# Patient Record
Sex: Female | Born: 1985 | Race: White | Hispanic: No | Marital: Single | Smoking: Current every day smoker
Health system: Southern US, Community
[De-identification: ages and names within clinical notes are randomized; demographics above are authoritative.]

## PROBLEM LIST (undated history)

## (undated) DIAGNOSIS — Z6836 Body mass index (BMI) 36.0-36.9, adult: Secondary | ICD-10-CM

## (undated) DIAGNOSIS — N83209 Unspecified ovarian cyst, unspecified side: Secondary | ICD-10-CM

## (undated) DIAGNOSIS — O1490 Unspecified pre-eclampsia, unspecified trimester: Secondary | ICD-10-CM

## (undated) HISTORY — PX: GALLBLADDER SURGERY: SHX652

## (undated) HISTORY — DX: Body mass index (BMI) 36.0-36.9, adult: Z68.36

## (undated) HISTORY — PX: OVARY SURGERY: SHX727

---

## 2009-02-12 ENCOUNTER — Ambulatory Visit: Payer: Self-pay | Admitting: Family Medicine

## 2009-02-14 ENCOUNTER — Ambulatory Visit: Payer: Self-pay | Admitting: Family Medicine

## 2015-06-20 ENCOUNTER — Encounter: Payer: Self-pay | Admitting: Emergency Medicine

## 2015-06-20 DIAGNOSIS — Y92410 Unspecified street and highway as the place of occurrence of the external cause: Secondary | ICD-10-CM | POA: Insufficient documentation

## 2015-06-20 DIAGNOSIS — F419 Anxiety disorder, unspecified: Secondary | ICD-10-CM | POA: Insufficient documentation

## 2015-06-20 DIAGNOSIS — Y9389 Activity, other specified: Secondary | ICD-10-CM | POA: Insufficient documentation

## 2015-06-20 DIAGNOSIS — S3992XA Unspecified injury of lower back, initial encounter: Secondary | ICD-10-CM | POA: Insufficient documentation

## 2015-06-20 DIAGNOSIS — Y998 Other external cause status: Secondary | ICD-10-CM | POA: Insufficient documentation

## 2015-06-20 DIAGNOSIS — Z3202 Encounter for pregnancy test, result negative: Secondary | ICD-10-CM | POA: Insufficient documentation

## 2015-06-20 DIAGNOSIS — Z720 Tobacco use: Secondary | ICD-10-CM | POA: Insufficient documentation
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
## 2015-06-21 ENCOUNTER — Emergency Department: Payer: Self-pay

## 2015-06-21 ENCOUNTER — Emergency Department
Admission: EM | Admit: 2015-06-21 | Discharge: 2015-06-21 | Disposition: A | Discharge status: Home / Self Care | Payer: Self-pay | Attending: Emergency Medicine | Admitting: Emergency Medicine

## 2015-06-21 DIAGNOSIS — M7918 Myalgia, other site: Secondary | ICD-10-CM

## 2015-06-21 LAB — POCT PREGNANCY, URINE: Preg Test, Ur: NEGATIVE

## 2015-06-21 MED ORDER — CYCLOBENZAPRINE HCL 10 MG PO TABS
10.0000 mg | ORAL_TABLET | Freq: Once | ORAL | Status: AC
  Administered 2015-06-21: 10 mg via ORAL

## 2015-06-21 MED ORDER — CYCLOBENZAPRINE HCL 10 MG PO TABS: 10.0000 mg | ORAL_TABLET | Freq: Three times a day (TID) | ORAL | Status: DC | PRN

## 2015-06-21 MED ORDER — CYCLOBENZAPRINE HCL 10 MG PO TABS
ORAL_TABLET | ORAL | Status: AC
  Administered 2015-06-21: 10 mg via ORAL
  Filled 2015-06-21: qty 1

## 2015-06-21 NOTE — ED Notes (Signed)
POC Urine Pregnancy resulted= NEGATIVE 

## 2015-06-21 NOTE — Discharge Instructions (Signed)
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
## 2018-04-08 ENCOUNTER — Emergency Department: Payer: No Typology Code available for payment source

## 2018-04-08 ENCOUNTER — Emergency Department
Admission: EM | Admit: 2018-04-08 | Discharge: 2018-04-08 | Disposition: A | Discharge status: Home / Self Care | Payer: No Typology Code available for payment source | Attending: Student in an Organized Health Care Education/Training Program | Admitting: Student in an Organized Health Care Education/Training Program

## 2018-04-08 DIAGNOSIS — M25512 Pain in left shoulder: Secondary | ICD-10-CM | POA: Diagnosis not present

## 2018-04-08 DIAGNOSIS — M542 Cervicalgia: Secondary | ICD-10-CM | POA: Insufficient documentation

## 2018-04-08 DIAGNOSIS — F17210 Nicotine dependence, cigarettes, uncomplicated: Secondary | ICD-10-CM | POA: Diagnosis not present

## 2018-04-08 MED ORDER — CYCLOBENZAPRINE HCL 10 MG PO TABS
5.0000 mg | ORAL_TABLET | Freq: Once | ORAL | Status: AC
  Administered 2018-04-08: 5 mg via ORAL
  Filled 2018-04-08: qty 1

## 2018-04-08 MED ORDER — MELOXICAM 15 MG PO TABS: 15.0000 mg | ORAL_TABLET | Freq: Every day | ORAL | 1 refills | Status: AC

## 2018-04-08 MED ORDER — CYCLOBENZAPRINE HCL 5 MG PO TABS: 5.0000 mg | ORAL_TABLET | Freq: Three times a day (TID) | ORAL | 0 refills | Status: AC | PRN

## 2018-04-08 MED ORDER — MELOXICAM 7.5 MG PO TABS
ORAL_TABLET | ORAL | Status: AC
  Filled 2018-04-08: qty 2

## 2018-04-08 MED ORDER — MELOXICAM 7.5 MG PO TABS
15.0000 mg | ORAL_TABLET | Freq: Every day | ORAL | Status: DC
  Administered 2018-04-08: 15 mg via ORAL
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
## 2018-04-08 NOTE — ED Notes (Signed)
Pt discharged to home.  Family member driving.  Discharge instructions reviewed.  Verbalized understanding.  No questions or concerns at this time.  Teach back verified.  Pt in NAD.  No items left in ED.   

## 2019-03-20 ENCOUNTER — Encounter (HOSPITAL_COMMUNITY)
Admission: EM | Disposition: A | Discharge status: Home / Self Care | Payer: Self-pay | Source: Home / Self Care | Attending: Emergency Medicine

## 2019-03-20 ENCOUNTER — Emergency Department (HOSPITAL_COMMUNITY): Payer: Self-pay | Admitting: Certified Registered"

## 2019-03-20 ENCOUNTER — Encounter (HOSPITAL_COMMUNITY): Payer: Self-pay | Admitting: Emergency Medicine

## 2019-03-20 ENCOUNTER — Other Ambulatory Visit: Payer: Self-pay

## 2019-03-20 ENCOUNTER — Observation Stay (HOSPITAL_COMMUNITY)
Admission: EM | Admit: 2019-03-20 | Discharge: 2019-03-21 | Disposition: A | Discharge status: Home / Self Care | Payer: Self-pay | Attending: Obstetrics & Gynecology | Admitting: Obstetrics & Gynecology

## 2019-03-20 ENCOUNTER — Emergency Department (HOSPITAL_COMMUNITY): Payer: Self-pay

## 2019-03-20 DIAGNOSIS — N838 Other noninflammatory disorders of ovary, fallopian tube and broad ligament: Secondary | ICD-10-CM | POA: Diagnosis present

## 2019-03-20 DIAGNOSIS — N83202 Unspecified ovarian cyst, left side: Principal | ICD-10-CM | POA: Insufficient documentation

## 2019-03-20 DIAGNOSIS — K659 Peritonitis, unspecified: Secondary | ICD-10-CM

## 2019-03-20 DIAGNOSIS — K661 Hemoperitoneum: Secondary | ICD-10-CM | POA: Insufficient documentation

## 2019-03-20 DIAGNOSIS — Z9889 Other specified postprocedural states: Secondary | ICD-10-CM

## 2019-03-20 DIAGNOSIS — F17210 Nicotine dependence, cigarettes, uncomplicated: Secondary | ICD-10-CM | POA: Insufficient documentation

## 2019-03-20 DIAGNOSIS — N9489 Other specified conditions associated with female genital organs and menstrual cycle: Secondary | ICD-10-CM

## 2019-03-20 DIAGNOSIS — R3911 Hesitancy of micturition: Secondary | ICD-10-CM | POA: Insufficient documentation

## 2019-03-20 DIAGNOSIS — R1900 Intra-abdominal and pelvic swelling, mass and lump, unspecified site: Secondary | ICD-10-CM

## 2019-03-20 DIAGNOSIS — I10 Essential (primary) hypertension: Secondary | ICD-10-CM | POA: Insufficient documentation

## 2019-03-20 HISTORY — PX: LAPAROSCOPIC SALPINGO OOPHERECTOMY: SHX5927

## 2019-03-20 HISTORY — DX: Unspecified pre-eclampsia, unspecified trimester: O14.90

## 2019-03-20 HISTORY — DX: Unspecified ovarian cyst, unspecified side: N83.209

## 2019-03-20 LAB — URINALYSIS, ROUTINE W REFLEX MICROSCOPIC
Bilirubin Urine: NEGATIVE
Glucose, UA: NEGATIVE mg/dL
Hgb urine dipstick: NEGATIVE
Ketones, ur: NEGATIVE mg/dL
Leukocytes,Ua: NEGATIVE
Nitrite: NEGATIVE
Protein, ur: NEGATIVE mg/dL
Specific Gravity, Urine: 1.018 (ref 1.005–1.030)
pH: 7 (ref 5.0–8.0)

## 2019-03-20 LAB — TYPE AND SCREEN
ABO/RH(D): A POS
Antibody Screen: NEGATIVE

## 2019-03-20 LAB — COMPREHENSIVE METABOLIC PANEL
ALT: 17 U/L (ref 0–44)
AST: 17 U/L (ref 15–41)
Albumin: 3.9 g/dL (ref 3.5–5.0)
Alkaline Phosphatase: 53 U/L (ref 38–126)
Anion gap: 10 (ref 5–15)
BUN: 14 mg/dL (ref 6–20)
CO2: 24 mmol/L (ref 22–32)
Calcium: 9.9 mg/dL (ref 8.9–10.3)
Chloride: 104 mmol/L (ref 98–111)
Creatinine, Ser: 0.83 mg/dL (ref 0.44–1.00)
GFR calc Af Amer: >60 mL/min (ref >60)
GFR calc non Af Amer: >60 mL/min (ref >60)
Glucose, Bld: 95 mg/dL (ref 70–99)
Potassium: 4 mmol/L (ref 3.5–5.1)
Sodium: 138 mmol/L (ref 135–145)
Total Bilirubin: 1.1 mg/dL (ref 0.3–1.2)
Total Protein: 6.8 g/dL (ref 6.5–8.1)

## 2019-03-20 LAB — CBC WITH DIFFERENTIAL/PLATELET
Abs Immature Granulocytes: 0.05 10*3/uL (ref 0.00–0.07)
Basophils Absolute: 0.1 10*3/uL (ref 0.0–0.1)
Basophils Relative: 0 %
Eosinophils Absolute: 0.4 10*3/uL (ref 0.0–0.5)
Eosinophils Relative: 3 %
HCT: 41 % (ref 36.0–46.0)
Hemoglobin: 13.3 g/dL (ref 12.0–15.0)
Immature Granulocytes: 0 %
Lymphocytes Relative: 25 %
Lymphs Abs: 3.5 10*3/uL (ref 0.7–4.0)
MCH: 30 pg (ref 26.0–34.0)
MCHC: 32.4 g/dL (ref 30.0–36.0)
MCV: 92.6 fL (ref 80.0–100.0)
Monocytes Absolute: 1 10*3/uL (ref 0.1–1.0)
Monocytes Relative: 7 %
Neutro Abs: 9 10*3/uL — ABNORMAL HIGH (ref 1.7–7.7)
Neutrophils Relative %: 65 %
Platelets: 285 10*3/uL (ref 150–400)
RBC: 4.43 MIL/uL (ref 3.87–5.11)
RDW: 12.7 % (ref 11.5–15.5)
WBC: 13.9 10*3/uL — ABNORMAL HIGH (ref 4.0–10.5)
nRBC: 0 % (ref 0.0–0.2)

## 2019-03-20 LAB — HCG, QUANTITATIVE, PREGNANCY: hCG, Beta Chain, Quant, S: 1 m[IU]/mL (ref <5)

## 2019-03-20 LAB — LIPASE, BLOOD: Lipase: 26 U/L (ref 11–51)

## 2019-03-20 LAB — I-STAT BETA HCG BLOOD, ED (MC, WL, AP ONLY): I-stat hCG, quantitative: <5 m[IU]/mL (ref <5)

## 2019-03-20 SURGERY — SALPINGO-OOPHORECTOMY, LAPAROSCOPIC
Anesthesia: General | Site: Abdomen | Laterality: Left

## 2019-03-20 MED ORDER — LACTATED RINGERS IV SOLN
INTRAVENOUS | Status: DC | PRN
  Administered 2019-03-20 – 2019-03-21 (×2): via INTRAVENOUS

## 2019-03-20 MED ORDER — DEXAMETHASONE SODIUM PHOSPHATE 10 MG/ML IJ SOLN
INTRAMUSCULAR | Status: DC | PRN
  Administered 2019-03-20: 10 mg via INTRAVENOUS

## 2019-03-20 MED ORDER — ACETAMINOPHEN 10 MG/ML IV SOLN
INTRAVENOUS | Status: AC
  Filled 2019-03-20: qty 100

## 2019-03-20 MED ORDER — MIDAZOLAM HCL 2 MG/2ML IJ SOLN
INTRAMUSCULAR | Status: AC
  Filled 2019-03-20: qty 2

## 2019-03-20 MED ORDER — SUFENTANIL CITRATE 50 MCG/ML IV SOLN
INTRAVENOUS | Status: AC
  Filled 2019-03-20: qty 1

## 2019-03-20 MED ORDER — HYDROMORPHONE HCL 1 MG/ML IJ SOLN
0.5000 mg | Freq: Once | INTRAMUSCULAR | Status: AC
  Administered 2019-03-20: 0.5 mg via INTRAVENOUS
  Filled 2019-03-20: qty 1

## 2019-03-20 MED ORDER — MIDAZOLAM HCL 5 MG/5ML IJ SOLN
INTRAMUSCULAR | Status: DC | PRN
  Administered 2019-03-20: 2 mg via INTRAVENOUS

## 2019-03-20 MED ORDER — LIDOCAINE HCL (CARDIAC) PF 100 MG/5ML IV SOSY
PREFILLED_SYRINGE | INTRAVENOUS | Status: DC | PRN
  Administered 2019-03-20: 60 mg via INTRATRACHEAL

## 2019-03-20 MED ORDER — PROPOFOL 10 MG/ML IV BOLUS
INTRAVENOUS | Status: AC
  Filled 2019-03-20: qty 20

## 2019-03-20 MED ORDER — ACETAMINOPHEN 10 MG/ML IV SOLN
INTRAVENOUS | Status: DC | PRN
  Administered 2019-03-20: 1000 mg via INTRAVENOUS

## 2019-03-20 MED ORDER — ROCURONIUM BROMIDE 100 MG/10ML IV SOLN
INTRAVENOUS | Status: DC | PRN
  Administered 2019-03-20: 10 mg via INTRAVENOUS

## 2019-03-20 MED ORDER — IOHEXOL 300 MG/ML  SOLN
100.0000 mL | Freq: Once | INTRAMUSCULAR | Status: AC | PRN
  Administered 2019-03-20: 100 mL via INTRAVENOUS

## 2019-03-20 MED ORDER — SUCCINYLCHOLINE CHLORIDE 20 MG/ML IJ SOLN
INTRAMUSCULAR | Status: DC | PRN
  Administered 2019-03-20: 100 mg via INTRAVENOUS

## 2019-03-20 MED ORDER — METOCLOPRAMIDE HCL 5 MG/ML IJ SOLN
10.0000 mg | Freq: Once | INTRAMUSCULAR | Status: AC
  Administered 2019-03-20: 10 mg via INTRAVENOUS
  Filled 2019-03-20: qty 2

## 2019-03-20 MED ORDER — PROPOFOL 10 MG/ML IV BOLUS
INTRAVENOUS | Status: DC | PRN
  Administered 2019-03-20: 50 mg via INTRAVENOUS
  Administered 2019-03-20: 100 mg via INTRAVENOUS

## 2019-03-20 MED ORDER — CEFAZOLIN SODIUM-DEXTROSE 2-4 GM/100ML-% IV SOLN
2.0000 g | INTRAVENOUS | Status: DC
  Filled 2019-03-20: qty 100

## 2019-03-20 MED ORDER — CEFAZOLIN SODIUM-DEXTROSE 2-3 GM-%(50ML) IV SOLR
INTRAVENOUS | Status: DC | PRN
  Administered 2019-03-20: 2 g via INTRAVENOUS

## 2019-03-20 MED ORDER — SUFENTANIL CITRATE 50 MCG/ML IV SOLN
INTRAVENOUS | Status: DC | PRN
  Administered 2019-03-20 (×2): 10 ug via INTRAVENOUS

## 2019-03-20 MED ORDER — SODIUM CHLORIDE 0.9 % IR SOLN
Status: DC | PRN
  Administered 2019-03-20: 3000 mL

## 2019-03-20 SURGICAL SUPPLY — 35 items
BARRIER ADHS 3X4 INTERCEED (GAUZE/BANDAGES/DRESSINGS) IMPLANT
COVER MAYO STAND STRL (DRAPES) ×3 IMPLANT
DECANTER SPIKE VIAL GLASS SM (MISCELLANEOUS) ×3 IMPLANT
DERMABOND ADVANCED (GAUZE/BANDAGES/DRESSINGS)
DERMABOND ADVANCED .7 DNX12 (GAUZE/BANDAGES/DRESSINGS) IMPLANT
DRSG OPSITE POSTOP 3X4 (GAUZE/BANDAGES/DRESSINGS) ×3 IMPLANT
ELECT REM PT RETURN 9FT ADLT (ELECTROSURGICAL) ×3
ELECTRODE REM PT RTRN 9FT ADLT (ELECTROSURGICAL) ×1 IMPLANT
FILTER SMOKE EVAC LAPAROSHD (FILTER) IMPLANT
GLOVE BIOGEL PI IND STRL 7.0 (GLOVE) ×1 IMPLANT
GLOVE BIOGEL PI IND STRL 8 (GLOVE) ×1 IMPLANT
GLOVE BIOGEL PI INDICATOR 7.0 (GLOVE) ×2
GLOVE BIOGEL PI INDICATOR 8 (GLOVE) ×2
GLOVE ECLIPSE 8.0 STRL XLNG CF (GLOVE) ×3 IMPLANT
GOWN STRL REUS W/ TWL LRG LVL3 (GOWN DISPOSABLE) ×2 IMPLANT
GOWN STRL REUS W/TWL LRG LVL3 (GOWN DISPOSABLE) ×4
NEEDLE INSUFFLATION 14GA 120MM (NEEDLE) ×3 IMPLANT
PACK LAPAROSCOPY II (CUSTOM PROCEDURE TRAY) ×3 IMPLANT
PACK TRENDGUARD 450 HYBRID PRO (MISCELLANEOUS) ×1 IMPLANT
POUCH SPECIMEN RETRIEVAL 10MM (ENDOMECHANICALS) ×3 IMPLANT
PROTECTOR NERVE ULNAR (MISCELLANEOUS) ×6 IMPLANT
SET IRRIG TUBING LAPAROSCOPIC (IRRIGATION / IRRIGATOR) ×3 IMPLANT
SHEARS HARMONIC ACE PLUS 36CM (ENDOMECHANICALS) ×3 IMPLANT
SLEEVE ENDOPATH XCEL 5M (ENDOMECHANICALS) ×3 IMPLANT
STAPLER VISISTAT 35W (STAPLE) ×3 IMPLANT
SUT VIC AB 3-0 FS2 27 (SUTURE) ×3 IMPLANT
SUT VIC AB 3-0 SH 27 (SUTURE)
SUT VIC AB 3-0 SH 27X BRD (SUTURE) IMPLANT
SUT VICRYL 0 UR6 27IN ABS (SUTURE) ×3 IMPLANT
TOWEL OR 17X24 6PK STRL BLUE (TOWEL DISPOSABLE) ×6 IMPLANT
TRAY FOLEY W/BAG SLVR 14FR (SET/KITS/TRAYS/PACK) ×3 IMPLANT
TRENDGUARD 450 HYBRID PRO PACK (MISCELLANEOUS) ×3
TROCAR BALLN 12MMX100 BLUNT (TROCAR) IMPLANT
TROCAR XCEL NON-BLD 11X100MML (ENDOMECHANICALS) ×3 IMPLANT
TROCAR XCEL NON-BLD 5MMX100MML (ENDOMECHANICALS) ×6 IMPLANT

## 2019-03-20 NOTE — ED Triage Notes (Signed)
Pt states yesterday she began having pain that she thought was a possible cyst, she describes not being able to urinate. States since the pain she has only voided a small amount, it did burn with urination. Pt also reports pain up under her rib. Pt states her anus hurts as well. Pt last BM was yesterday.

## 2019-03-20 NOTE — ED Provider Notes (Signed)
[facility] Provider Note   CSN: [id number] Arrival date & time: 03/20/19  1652    History   Chief Complaint Chief Complaint  Patient presents with  . Abdominal Pain    HPI Cheryl Munoz is a 33 y.o. female who presents with abdominal pain. Onset fof sxs begain yesterday she started having pain on the Left side of her abdomen . She she states that the pain is now generalized but worst under her left rib. She describes the pain as severe, constant, aching, Worse with any movement. She has a hx of of ruptured ovarian cyst and thought her the pain might be the same. She feels urgency to urinate but states she has only been able to dribble urine when she tries to micturate.She has some burning. She denies flank pain, nausea or vomiting. She had pain with intercourse last night but denies other vaginal symptoms. She feels pain in her rectum but denies tenesmus or rectal urgency. She has never had anything like this before.      HPI  Past Medical History:  Diagnosis Date  . Ovarian cyst   . Preeclampsia     There are no active problems to display for this patient.   History reviewed. No pertinent surgical history.   OB History   No obstetric history on file.      Home Medications    Prior to Admission medications   Medication Sig Start Date End Date Taking? Authorizing Provider  citalopram (CELEXA) 20 MG tablet Take 20 mg by mouth daily.    [provider]    Family History No family history on file.  Social History Social History   Tobacco Use  . Smoking status: Current Every Day Smoker    Packs/day: 0.50    Types: Cigarettes  . Smokeless tobacco: Never Used  Substance Use Topics  . Alcohol use: Yes    Comment: occassional  . Drug use: Not on file     Allergies   Patient has no known allergies.   Review of Systems Review of Systems Ten systems reviewed and are negative for acute change, except as noted in  the HPI.    Physical Exam Updated Vital Signs BP (!) 133/98 (BP Location: Right Arm)   Pulse 86   Temp 98.1 F (36.7 C) (Oral)   Resp (!) 30   Ht 5\' 3"  (1.6 m)   Wt 52.2 kg   SpO2 94%   BMI 20.37 kg/m   Physical Exam Vitals signs and nursing note reviewed.  Constitutional:      General: She is not in acute distress.    Appearance: She is well-developed. She is not diaphoretic.  HENT:     Head: Normocephalic and atraumatic.  Eyes:     General: No scleral icterus.    Conjunctiva/sclera: Conjunctivae normal.  Neck:     Musculoskeletal: Normal range of motion.  Cardiovascular:     Rate and Rhythm: Normal rate and regular rhythm.     Heart sounds: Normal heart sounds. No murmur. No friction rub. No gallop.   Pulmonary:     Effort: Pulmonary effort is normal. No respiratory distress.     Breath sounds: Normal breath sounds.  Abdominal:     General: Bowel sounds are normal. There is distension.     Palpations: Abdomen is soft. There is no mass.     Tenderness: There is generalized abdominal tenderness. There is guarding.     Comments: Abdominal distension, guarding,  generalized tenderness.exquisetly tender with any palpation or movement.- peritonitic  Skin:    General: Skin is warm and dry.  Neurological:     Mental Status: She is alert and oriented to person, place, and time.  Psychiatric:        Behavior: Behavior normal.      ED Treatments / Results  Labs (all labs ordered are listed, but only abnormal results are displayed) Labs Reviewed  URINALYSIS, ROUTINE W REFLEX MICROSCOPIC  CBC WITH DIFFERENTIAL/PLATELET  COMPREHENSIVE METABOLIC PANEL  LIPASE, BLOOD  URINALYSIS, ROUTINE W REFLEX MICROSCOPIC    EKG None  Radiology No results found.  Procedures .Critical Care Performed by: Arthor Captain, PA-C Authorized by: Arthor Captain, PA-C   Critical care provider statement:    Critical care time (minutes):  70   Critical care was necessary to treat or  prevent imminent or life-threatening deterioration of the following conditions: complex hemorrhagic pelvic mass- emergent surgery.   Critical care was time spent personally by me on the following activities:  Discussions with consultants, evaluation of patient's response to treatment, examination of patient, ordering and performing treatments and interventions, ordering and review of laboratory studies, ordering and review of radiographic studies, pulse oximetry, re-evaluation of patient's condition, obtaining history from patient or surrogate and review of old charts   (including critical care time)  Medications Ordered in ED Medications  metoCLOPramide (REGLAN) injection 10 mg (10 mg Intravenous Given 03/20/19 1720)  HYDROmorphone (DILAUDID) injection 0.5 mg (0.5 mg Intravenous Given 03/20/19 1720)     Initial Impression / Assessment and Plan / ED Course  I have reviewed the triage vital signs and the nursing notes.  Pertinent labs & imaging results that were available during my care of the patient were reviewed by me and considered in my medical decision making (see chart for details).  Clinical Course as of Mar 19 2226  Sun Mar 20, 2019  2683 I spoke with Dr. Purcell Mouton of Radiology regarding abnromal CT findings. Suggests emergent GYN consult.   [AH]  1924 I reviewed the CT scan on consult call with DR. Turner Daniels. He asks that I follow up with US of the pelvis.   [AH]  2120 [place] Returned, patient still in significant Pain. Dr. Despina Hidden will    [AH]    Clinical Course User Index [AH] Arthor Captain, PA-C       [id number] pain VS:  Vitals:   03/20/19 1700 03/20/19 2020 03/20/19 2118 03/20/19 2215  BP:  (!) 145/129 123/78 136/80  Pulse:  72 75 72  Resp:  16 14 12   Temp:      TempSrc:      SpO2:   95% 96%  Weight: 52.2 kg     Height: 5\' 3"  (1.6 m)       XQ:JJHERDE is gathered by patient  and emr. DDX:The causes for generalized abdominal pain include but are not limited to  gastritis, gastroenteritis, IBS, pancreatitis, peritonitis, intestinal ischemia, constipation, UTI, intestinal obstruction, perforated viscus, eg, peptic ulcer, appendix, gallbladder, diverticulitis, physical or sexual abuse, abdominal abscess,ruptured ectopic pregnancy, ruptured spleen, AAA, diabetic ketoacidosis, hypercalcemia, uremia, parasitic or other infection, eg: tapeworms, flukes, Giargia, Cryto, Yersinia, adrenal insufficiency,lead poisoning, iron toxicity, polyarteritis nodosa, Henoch-Schnlein purpura, porphyria, eg, acute intermittent porphyria, familial Mediterranean fever.  Labs: I reviewed the labs which show leukocytosis, negative pregnancy test, no other abnormaities Imaging: I personally reviewed the images (CT abdoment and pelvic/ vaginal [place] with doppler) which show(s) Poorly demarcated mass of the pelvis with hemmorhage  EKG:N/A MDM: Patient with left adnexal mass causing significant pain and peritonitis.  Differential as indicated by radiology includes malignancy, endometrioma with hemorrhage, complex hemorrhagic cyst.  There is no evidence of ruptured ectopic pregnancy.  Patient will go for emergent ovarian cystectomy with Dr. Turner Daniels.  Pain is improved here in the emergency department she has required multiple pain doses for her pain control. Patient disposition: Admission Patient condition: Stable. The patient appears reasonably stabilized for admission considering the current resources, flow, and capabilities available in the ED at this time, and I doubt any other [facility] requiring further screening and/or treatment in the ED prior to admission.   Final Clinical Impressions(s) / ED Diagnoses   Final diagnoses:  None    ED Discharge Orders    None       Arthor Captain, PA-C 03/20/19 2230    Wynetta Fines, MD 03/21/19 1900

## 2019-03-20 NOTE — Anesthesia Preprocedure Evaluation (Addendum)
Anesthesia Evaluation  Patient identified by MRN, date of birth, ID band Patient awake    Reviewed: Allergy & Precautions, NPO status , Patient's Chart, lab work & pertinent test results  Airway Mallampati: II  TM Distance: >3 FB Neck ROM: Full    Dental no notable dental hx.    Pulmonary Current Smoker,    Pulmonary exam normal breath sounds clear to auscultation       Cardiovascular hypertension, Normal cardiovascular exam Rhythm:Regular Rate:Normal     Neuro/Psych negative neurological ROS  negative psych ROS   GI/Hepatic negative GI ROS, Neg liver ROS,   Endo/Other  negative endocrine ROS  Renal/GU negative Renal ROS     Musculoskeletal negative musculoskeletal ROS (+)   Abdominal   Peds  Hematology negative hematology ROS (+)   Anesthesia Other Findings Ovarian Hematoma  Reproductive/Obstetrics                            Anesthesia Physical Anesthesia Plan  ASA: II and emergent  Anesthesia Plan: General   Post-op Pain Management:    Induction: Intravenous and Rapid sequence  PONV Risk Score and Plan: 3 and Midazolam, Dexamethasone, Ondansetron and Treatment may vary due to age or medical condition  Airway Management Planned: Oral ETT  Additional Equipment:   Intra-op Plan:   Post-operative Plan: Extubation in OR  Informed Consent: I have reviewed the patients History and Physical, chart, labs and discussed the procedure including the risks, benefits and alternatives for the proposed anesthesia with the patient or authorized representative who has indicated his/her understanding and acceptance.     Dental advisory given  Plan Discussed with: CRNA  Anesthesia Plan Comments:        Anesthesia Quick Evaluation

## 2019-03-20 NOTE — Anesthesia Procedure Notes (Signed)
Procedure Name: Intubation Date/Time: 03/20/2019 11:00 PM Performed by: Claris Che, CRNA Pre-anesthesia Checklist: Patient identified, Emergency Drugs available, Suction available, Patient being monitored and Timeout performed Patient Re-evaluated:Patient Re-evaluated prior to induction Oxygen Delivery Method: Circle system utilized Preoxygenation: Pre-oxygenation with 100% oxygen Induction Type: IV induction, Rapid sequence and Cricoid Pressure applied Laryngoscope Size: Mac and 3 Grade View: Grade II Tube type: Oral Tube size: 7.5 mm Number of attempts: 1 Airway Equipment and Method: Stylet Placement Confirmation: positive ETCO2,  ETT inserted through vocal cords under direct vision and breath sounds checked- equal and bilateral Secured at: 23 cm Tube secured with: Tape Dental Injury: Teeth and Oropharynx as per pre-operative assessment

## 2019-03-20 NOTE — ED Notes (Signed)
Patient transported to Ultrasound 

## 2019-03-20 NOTE — H&P (Signed)
Preoperative History and Physical  Cheryl Munoz is a 33 y.o. No obstetric history on file. with No LMP recorded. (Menstrual status: IUD). admitted for a laparoscopic left salpino oophorectomy for management of acute left ovarian hematoma, 11+ cm.  Unsure of original etiology but best guess is she had an ovarian cyst which was disrupted during sexual intercourse and bled resulting in an 11 cm hematoma/mass complex  Discussed options and will proceed with removal  PMH:    Past Medical History:  Diagnosis Date  . Ovarian cyst   . Preeclampsia     PSH:    History reviewed. No pertinent surgical history.  POb/GynH:      OB History   No obstetric history on file.   G2P2 Undesired fertility IUD in proper location  SH:   Social History   Tobacco Use  . Smoking status: Current Every Day Smoker    Packs/day: 0.50    Types: Cigarettes  . Smokeless tobacco: Never Used  Substance Use Topics  . Alcohol use: Yes    Comment: occassional  . Drug use: Not on file    FH:   No family history on file.   Allergies: No Known Allergies  Medications:      No current facility-administered medications for this encounter.   Current Outpatient Medications:  .  acetaminophen (TYLENOL) 500 MG tablet, Take 1,000 mg by mouth every 6 (six) hours as needed for moderate pain or headache., Disp: , Rfl:  .  cetirizine (ZYRTEC) 10 MG tablet, Take 10 mg by mouth as needed for allergies., Disp: , Rfl:  .  ibuprofen (ADVIL) 200 MG tablet, Take 400 mg by mouth every 6 (six) hours as needed for headache or mild pain., Disp: , Rfl:  .  levocetirizine (XYZAL) 5 MG tablet, Take 5 mg by mouth as needed for allergies., Disp: , Rfl:  .  naproxen sodium (ALEVE) 220 MG tablet, Take 440 mg by mouth as needed (pain)., Disp: , Rfl:   Review of Systems:   Review of Systems  Constitutional: Negative for fever, chills, weight loss, malaise/fatigue and diaphoresis.  HENT: Negative for hearing loss, ear pain,  nosebleeds, congestion, sore throat, neck pain, tinnitus and ear discharge.   Eyes: Negative for blurred vision, double vision, photophobia, pain, discharge and redness.  Respiratory: Negative for cough, hemoptysis, sputum production, shortness of breath, wheezing and stridor.   Cardiovascular: Negative for chest pain, palpitations, orthopnea, claudication, leg swelling and PND.  Gastrointestinal: Positive for abdominal pain. Negative for heartburn, nausea, vomiting, diarrhea, constipation, blood in stool and melena.  Genitourinary: Negative for dysuria, urgency, frequency, hematuria and flank pain.  Musculoskeletal: Negative for myalgias, back pain, joint pain and falls.  Skin: Negative for itching and rash.  Neurological: Negative for dizziness, tingling, tremors, sensory change, speech change, focal weakness, seizures, loss of consciousness, weakness and headaches.  Endo/Heme/Allergies: Negative for environmental allergies and polydipsia. Does not bruise/bleed easily.  Psychiatric/Behavioral: Negative for depression, suicidal ideas, hallucinations, memory loss and substance abuse. The patient is not nervous/anxious and does not have insomnia.      PHYSICAL EXAM:  Blood pressure 123/78, pulse 75, temperature 98.1 F (36.7 C), temperature source Oral, resp. rate 14, height  (1.6 m), weight 52.2 kg, SpO2 95 %.    Vitals reviewed. Constitutional: She is oriented to person, place, and time. She appears well-developed and well-nourished.  HENT:  Head: Normocephalic and atraumatic.  Right Ear: External ear normal.  Left Ear: External ear normal.  Nose: Nose normal.  Mouth/Throat: Oropharynx is clear and moist.  Eyes: Conjunctivae and EOM are normal. Pupils are equal, round, and reactive to light. Right eye exhibits no discharge. Left eye exhibits no discharge. No scleral icterus.  Neck: Normal range of motion. Neck supple. No tracheal deviation present. No thyromegaly present.   Cardiovascular: Normal rate, regular rhythm, normal heart sounds and intact distal pulses.  Exam reveals no gallop and no friction rub.   No murmur heard. Respiratory: Effort normal and breath sounds normal. No respiratory distress. She has no wheezes. She has no rales. She exhibits no tenderness.  GI: Soft. Bowel sounds are normal. She exhibits no distension and no mass. There is tenderness. There is no rebound and no guarding.  Genitourinary:       Vulva is normal without lesions Vagina is pink moist without discharge Cervix normal in appearance and pap is normal Uterus is normal size, contour, position, consistency, mobility, non-tenderIUD in proper location Adnexa per sonogram Musculoskeletal: Normal range of motion. She exhibits no edema and no tenderness.  Neurological: She is alert and oriented to person, place, and time. She has normal reflexes. She displays normal reflexes. No cranial nerve deficit. She exhibits normal muscle tone. Coordination normal.  Skin: Skin is warm and dry. No rash noted. No erythema. No pallor.  Psychiatric: She has a normal mood and affect. Her behavior is normal. Judgment and thought content normal.    Labs: Results for orders placed or performed during the hospital encounter of 03/20/19 (from the past 336 hour(s))  CBC with Differential   Collection Time: 03/20/19  5:25 PM  Result Value Ref Range   WBC 13.9 (H) 4.0 - 10.5 K/uL   RBC 4.43 3.87 - 5.11 MIL/uL   Hemoglobin 13.3 12.0 - 15.0 g/dL   HCT 16.141.0 09.636.0 - 04.546.0 %   MCV 92.6 80.0 - 100.0 fL   MCH 30.0 26.0 - 34.0 pg   MCHC 32.4 30.0 - 36.0 g/dL   RDW 40.912.7 81.111.5 - 91.415.5 %   Platelets 285 150 - 400 K/uL   nRBC 0.0 0.0 - 0.2 %   Neutrophils Relative % 65 %   Neutro Abs 9.0 (H) 1.7 - 7.7 K/uL   Lymphocytes Relative 25 %   Lymphs Abs 3.5 0.7 - 4.0 K/uL   Monocytes Relative 7 %   Monocytes Absolute 1.0 0.1 - 1.0 K/uL   Eosinophils Relative 3 %   Eosinophils Absolute 0.4 0.0 - 0.5 K/uL   Basophils  Relative 0 %   Basophils Absolute 0.1 0.0 - 0.1 K/uL   Immature Granulocytes 0 %   Abs Immature Granulocytes 0.05 0.00 - 0.07 K/uL  Comprehensive metabolic panel   Collection Time: 03/20/19  5:25 PM  Result Value Ref Range   Sodium 138 135 - 145 mmol/L   Potassium 4.0 3.5 - 5.1 mmol/L   Chloride 104 98 - 111 mmol/L   CO2 24 22 - 32 mmol/L   Glucose, Bld 95 70 - 99 mg/dL   BUN 14 6 - 20 mg/dL   Creatinine, Ser 7.820.83 0.44 - 1.00 mg/dL   Calcium 9.9 8.9 - 95.610.3 mg/dL   Total Protein 6.8 6.5 - 8.1 g/dL   Albumin 3.9 3.5 - 5.0 g/dL   AST 17 15 - 41 U/L   ALT 17 0 - 44 U/L   Alkaline Phosphatase 53 38 - 126 U/L   Total Bilirubin 1.1 0.3 - 1.2 mg/dL   GFR calc non Af Amer >60 >60 mL/min   GFR  calc Af Amer >60 >60 mL/min   Anion gap 10 5 - 15  Lipase, blood   Collection Time: 03/20/19  5:25 PM  Result Value Ref Range   Lipase 26 11 - 51 U/L  I-Stat beta hCG blood, ED   Collection Time: 03/20/19  5:29 PM  Result Value Ref Range   I-stat hCG, quantitative <5.0 <5 mIU/mL   Comment 3          Urinalysis, Routine w reflex microscopic   Collection Time: 03/20/19  5:45 PM  Result Value Ref Range   Color, Urine YELLOW YELLOW   APPearance CLEAR CLEAR   Specific Gravity, Urine 1.018 1.005 - 1.030   pH 7.0 5.0 - 8.0   Glucose, UA NEGATIVE NEGATIVE mg/dL   Hgb urine dipstick NEGATIVE NEGATIVE   Bilirubin Urine NEGATIVE NEGATIVE   Ketones, ur NEGATIVE NEGATIVE mg/dL   Protein, ur NEGATIVE NEGATIVE mg/dL   Nitrite NEGATIVE NEGATIVE   Leukocytes,Ua NEGATIVE NEGATIVE    EKG: No orders found for this or any previous visit.  Imaging Studies: US Transvaginal Non-ob  Result Date: 03/20/2019 CLINICAL DATA:  Initial evaluation for pelvic mass and bleeding seen on prior CT. EXAM: TRANSABDOMINAL AND TRANSVAGINAL ULTRASOUND OF PELVIS DOPPLER ULTRASOUND OF OVARIES TECHNIQUE: Both transabdominal and transvaginal ultrasound examinations of the pelvis were performed. Transabdominal technique was  performed for global imaging of the pelvis including uterus, ovaries, adnexal regions, and pelvic cul-de-sac. It was necessary to proceed with endovaginal exam following the transabdominal exam to visualize the uterus, endometrium, and ovaries. Color and duplex Doppler ultrasound was utilized to evaluate blood flow to the ovaries. COMPARISON:  Prior CT from earlier the same day as well as previous ultrasound from 02/14/2009. FINDINGS: Uterus Measurements: 8.8 x 3.4 x 4.2 cm = volume: 64.4 mL. No fibroids or other mass visualized. Endometrium IUD in place within the endometrial cavity, limiting assessment. No focal abnormality. Right ovary Measurements: 4.5 x 3.0 x 2.9 cm = volume: 20.4 mL. Normal appearance/no adnexal mass. Left ovary Complex left adnexal mass measuring 11.6 x 6.3 x 7.5 cm, corresponding with abnormality seen on prior CT. Lesion demonstrates mixed echogenicity, suspected to in large part reflect blood products given appearance on prior CT. Lesion likely arises from the left ovary, with some ovarian tissue appearing at the margin of this lesion. Scattered areas of internal vascularity seen, which could be within the left ovary itself. Previously seen active hemorrhage not visible by ultrasound. Pulsed Doppler evaluation of the right ovary demonstrates normal low-resistance arterial and venous waveforms. Normal arterial and venous waveforms also seen within the left adnexal lesion, favored to be within the left ovary. Other findings Moderate complex fluid seen within the pelvis, which could reflect blood. IMPRESSION: 1. 11.6 cm complex left adnexal/pelvic mass, corresponding with abnormality seen on prior CT. Finding is indeterminate, and could reflect a ruptured and/or complex cyst with associated hematoma formation, an endometrioma which has bled, or possibly an underlying mass/malignancy with associated hemorrhage. Ruptured ectopic pregnancy is excluded given the negative pregnancy test.  Gynecologic referral for surgical consultation recommended. Additionally, further assessment with dedicated pelvic MRI, with and without contrast, could be performed for further evaluation as clinically desired. 2. No evidence for ovarian torsion. Areas of preserved arterial and venous Doppler waveform are seen within the left pelvic mass, favored to lie within the underlying left ovary. 3. Associated moderate volume complex free fluid within the pelvis, likely blood products. 4. IUD in place within the uterus. Electronically Signed  By: Rise Mu M.D.   On: 03/20/2019 21:12   US Pelvis Complete  Result Date: 03/20/2019 CLINICAL DATA:  Initial evaluation for pelvic mass and bleeding seen on prior CT. EXAM: TRANSABDOMINAL AND TRANSVAGINAL ULTRASOUND OF PELVIS DOPPLER ULTRASOUND OF OVARIES TECHNIQUE: Both transabdominal and transvaginal ultrasound examinations of the pelvis were performed. Transabdominal technique was performed for global imaging of the pelvis including uterus, ovaries, adnexal regions, and pelvic cul-de-sac. It was necessary to proceed with endovaginal exam following the transabdominal exam to visualize the uterus, endometrium, and ovaries. Color and duplex Doppler ultrasound was utilized to evaluate blood flow to the ovaries. COMPARISON:  Prior CT from earlier the same day as well as previous ultrasound from 02/14/2009. FINDINGS: Uterus Measurements: 8.8 x 3.4 x 4.2 cm = volume: 64.4 mL. No fibroids or other mass visualized. Endometrium IUD in place within the endometrial cavity, limiting assessment. No focal abnormality. Right ovary Measurements: 4.5 x 3.0 x 2.9 cm = volume: 20.4 mL. Normal appearance/no adnexal mass. Left ovary Complex left adnexal mass measuring 11.6 x 6.3 x 7.5 cm, corresponding with abnormality seen on prior CT. Lesion demonstrates mixed echogenicity, suspected to in large part reflect blood products given appearance on prior CT. Lesion likely arises from the  left ovary, with some ovarian tissue appearing at the margin of this lesion. Scattered areas of internal vascularity seen, which could be within the left ovary itself. Previously seen active hemorrhage not visible by ultrasound. Pulsed Doppler evaluation of the right ovary demonstrates normal low-resistance arterial and venous waveforms. Normal arterial and venous waveforms also seen within the left adnexal lesion, favored to be within the left ovary. Other findings Moderate complex fluid seen within the pelvis, which could reflect blood. IMPRESSION: 1. 11.6 cm complex left adnexal/pelvic mass, corresponding with abnormality seen on prior CT. Finding is indeterminate, and could reflect a ruptured and/or complex cyst with associated hematoma formation, an endometrioma which has bled, or possibly an underlying mass/malignancy with associated hemorrhage. Ruptured ectopic pregnancy is excluded given the negative pregnancy test. Gynecologic referral for surgical consultation recommended. Additionally, further assessment with dedicated pelvic MRI, with and without contrast, could be performed for further evaluation as clinically desired. 2. No evidence for ovarian torsion. Areas of preserved arterial and venous Doppler waveform are seen within the left pelvic mass, favored to lie within the underlying left ovary. 3. Associated moderate volume complex free fluid within the pelvis, likely blood products. 4. IUD in place within the uterus. Electronically Signed   By: Rise Mu M.D.   On: 03/20/2019 21:12   Ct Abdomen Pelvis W Contrast  Result Date: 03/20/2019 CLINICAL DATA:  Acute generalized abdominal pain for 1 day. Dysuria. Negative serum pregnancy test. EXAM: CT ABDOMEN AND PELVIS WITH CONTRAST TECHNIQUE: Multidetector CT imaging of the abdomen and pelvis was performed using the standard protocol following bolus administration of intravenous contrast. CONTRAST:  OMNIPAQUE IOHEXOL 300 MG/ML  SOLN  COMPARISON:  Abdominopelvic CT 02/12/2009. Pelvic ultrasound 02/14/2009. FINDINGS: Lower chest: Clear lung bases. No significant pleural or pericardial effusion. Hepatobiliary: The liver is normal in density without suspicious focal abnormality. There are several nitrogen containing gallstones. There is minimal gallbladder wall thickening without surrounding inflammation. There is no biliary dilatation. Pancreas: Unremarkable. No pancreatic ductal dilatation or surrounding inflammatory changes. Spleen: Normal in size without focal abnormality. Adrenals/Urinary Tract: Both adrenal glands appear normal. The kidneys, ureters and bladder appear normal. There is no evidence of urinary tract calculus or hydronephrosis. Stomach/Bowel: No evidence of bowel wall  thickening, distention or surrounding inflammatory change. The appendix is not confidently identified, although there is no evidence of pericecal inflammation. Vascular/Lymphatic: There are no enlarged abdominal or pelvic lymph nodes. No significant vascular findings in the abdomen. See reproductive section below. Reproductive: Intrauterine device is in place. The uterus is anteriorly displaced by a complex soft tissue mass in the posterior pelvis measuring approximately 10.1 x 6.4 x 9.4 cm. There are prominent vessels surrounding this mass with possible active internal bleeding (images 66-68 of series 3). There was a smaller, less complex appearing mass in this area on the remote pelvic CT. The ovaries are not confidently identified separate from this pelvic process. Other: There is a small to moderate amount free peritoneal fluid which measures slightly higher than water density. There is no layering high density within this fluid. Intact anterior abdominal wall. Musculoskeletal: No acute or significant osseous findings. IMPRESSION: 1. Large atypical complex mass in the pelvic cul-de-sac with suggestion of active bleeding. Some of this mass may reflect hematoma.  Given the presence a smaller less complex mass in this area previously, the possibility of endometriosis is raised. Ruptured ectopic pregnancy is excluded by the negative pregnancy test. Malignancy is unlikely in this age group. 2. Intermediate density ascites, possibly hemoperitoneum. 3. Surgical evaluation recommended. 4. Cholelithiasis without evidence of cholecystitis. 5. These results were called by telephone at the time of interpretation on 03/20/2019 at 7:21 pm to [facility] , who verbally acknowledged these results. Electronically Signed   By: Carey Bullocks M.D.   On: 03/20/2019 19:23   [place] Art/ven Flow Abd Pelv Doppler  Result Date: 03/20/2019 CLINICAL DATA:  Initial evaluation for pelvic mass and bleeding seen on prior CT. EXAM: TRANSABDOMINAL AND TRANSVAGINAL ULTRASOUND OF PELVIS DOPPLER ULTRASOUND OF OVARIES TECHNIQUE: Both transabdominal and transvaginal ultrasound examinations of the pelvis were performed. Transabdominal technique was performed for global imaging of the pelvis including uterus, ovaries, adnexal regions, and pelvic cul-de-sac. It was necessary to proceed with endovaginal exam following the transabdominal exam to visualize the uterus, endometrium, and ovaries. Color and duplex Doppler ultrasound was utilized to evaluate blood flow to the ovaries. COMPARISON:  Prior CT from earlier the same day as well as previous ultrasound from 02/14/2009. FINDINGS: Uterus Measurements: 8.8 x 3.4 x 4.2 cm = volume: 64.4 mL. No fibroids or other mass visualized. Endometrium IUD in place within the endometrial cavity, limiting assessment. No focal abnormality. Right ovary Measurements: 4.5 x 3.0 x 2.9 cm = volume: 20.4 mL. Normal appearance/no adnexal mass. Left ovary Complex left adnexal mass measuring 11.6 x 6.3 x 7.5 cm, corresponding with abnormality seen on prior CT. Lesion demonstrates mixed echogenicity, suspected to in large part reflect blood products given appearance on prior CT. Lesion  likely arises from the left ovary, with some ovarian tissue appearing at the margin of this lesion. Scattered areas of internal vascularity seen, which could be within the left ovary itself. Previously seen active hemorrhage not visible by ultrasound. Pulsed Doppler evaluation of the right ovary demonstrates normal low-resistance arterial and venous waveforms. Normal arterial and venous waveforms also seen within the left adnexal lesion, favored to be within the left ovary. Other findings Moderate complex fluid seen within the pelvis, which could reflect blood. IMPRESSION: 1. 11.6 cm complex left adnexal/pelvic mass, corresponding with abnormality seen on prior CT. Finding is indeterminate, and could reflect a ruptured and/or complex cyst with associated hematoma formation, an endometrioma which has bled, or possibly an underlying mass/malignancy with associated hemorrhage. Ruptured ectopic  pregnancy is excluded given the negative pregnancy test. Gynecologic referral for surgical consultation recommended. Additionally, further assessment with dedicated pelvic MRI, with and without contrast, could be performed for further evaluation as clinically desired. 2. No evidence for ovarian torsion. Areas of preserved arterial and venous Doppler waveform are seen within the left pelvic mass, favored to lie within the underlying left ovary. 3. Associated moderate volume complex free fluid within the pelvis, likely blood products. 4. IUD in place within the uterus. Electronically Signed   By: Rise Mu M.D.   On: 03/20/2019 21:12      Assessment: Left ovarian mass with hematoma, with associated acute pain  Plan: Laparoscopic left salpingo oophorectomy for removal of left ovarian mass and hematoma  Lazaro Arms 03/20/2019 9:40 PM

## 2019-03-20 NOTE — ED Notes (Signed)
Pt in [place], refusing to have scan until given pain medication

## 2019-03-20 NOTE — ED Notes (Signed)
ED TO INPATIENT HANDOFF REPORT  ED Nurse Name and Phone #: [phone] Windy Dudek  S Name/Age/Gender Cheryl Munoz 33 y.o. female Room/Bed: 008C/008C  Code Status   Code Status: Full Code  Home/SNF/Other Home Patient oriented to: self, place, time and situation Is this baseline? Yes   Triage Complete: Triage complete  Chief Complaint unable to urinate  Triage Note Pt states yesterday she began having pain that she thought was a possible cyst, she describes not being able to urinate. States since the pain she has only voided a small amount, it did burn with urination. Pt also reports pain up under her rib. Pt states her anus hurts as well. Pt last BM was yesterday.    Allergies No Known Allergies  Level of Care/Admitting Diagnosis ED Disposition    ED Disposition Condition Comment   Admit  Hospital Area: [facility] [[id number]]  Level of Care: Med-Surg [16]  Covid Evaluation: N/A  Diagnosis: Ovarian mass, left [[id number]]  Admitting Physician: Lazaro Arms [[id number]]  Attending Physician: Lazaro Arms [[id number]]  PT Class (Do Not Modify): Observation [104]  PT Acc Code (Do Not Modify): Observation [10022]       B Medical/Surgery History Past Medical History:  Diagnosis Date  . Ovarian cyst   . Preeclampsia    History reviewed. No pertinent surgical history.   A IV Location/Drains/Wounds Patient Lines/Drains/Airways Status   Active Line/Drains/Airways    Name:   Placement date:   Placement time:   Site:   Days:   Peripheral IV 03/20/19 Right;Anterior Antecubital   03/20/19    1706    Antecubital   less than 1          Intake/Output Last 24 hours  Intake/Output Summary (Last 24 hours) at 03/20/2019 2156 Last data filed at 03/20/2019 1746 Gross per 24 hour  Intake -  Output 200 ml  Net -200 ml    Labs/Imaging Results for orders placed or performed during the hospital encounter of 03/20/19 (from the past 48 hour(s))  CBC with Differential      Status: Abnormal   Collection Time: 03/20/19  5:25 PM  Result Value Ref Range   WBC 13.9 (H) 4.0 - 10.5 K/uL   RBC 4.43 3.87 - 5.11 MIL/uL   Hemoglobin 13.3 12.0 - 15.0 g/dL   HCT 19.1 47.8 - 29.5 %   MCV 92.6 80.0 - 100.0 fL   MCH 30.0 26.0 - 34.0 pg   MCHC 32.4 30.0 - 36.0 g/dL   RDW 62.1 30.8 - 65.7 %   Platelets 285 150 - 400 K/uL   nRBC 0.0 0.0 - 0.2 %   Neutrophils Relative % 65 %   Neutro Abs 9.0 (H) 1.7 - 7.7 K/uL   Lymphocytes Relative 25 %   Lymphs Abs 3.5 0.7 - 4.0 K/uL   Monocytes Relative 7 %   Monocytes Absolute 1.0 0.1 - 1.0 K/uL   Eosinophils Relative 3 %   Eosinophils Absolute 0.4 0.0 - 0.5 K/uL   Basophils Relative 0 %   Basophils Absolute 0.1 0.0 - 0.1 K/uL   Immature Granulocytes 0 %   Abs Immature Granulocytes 0.05 0.00 - 0.07 K/uL    Comment: Performed at [facility], [address] [place], [place] [address]  Comprehensive metabolic panel     Status: None   Collection Time: 03/20/19  5:25 PM  Result Value Ref Range   Sodium 138 135 - 145 mmol/L   Potassium 4.0 3.5 -  5.1 mmol/L   Chloride 104 98 - 111 mmol/L   CO2 24 22 - 32 mmol/L   Glucose, Bld 95 70 - 99 mg/dL   BUN 14 6 - 20 mg/dL   Creatinine, Ser 1.61 0.44 - 1.00 mg/dL   Calcium 9.9 8.9 - 09.6 mg/dL   Total Protein 6.8 6.5 - 8.1 g/dL   Albumin 3.9 3.5 - 5.0 g/dL   AST 17 15 - 41 U/L   ALT 17 0 - 44 U/L   Alkaline Phosphatase 53 38 - 126 U/L   Total Bilirubin 1.1 0.3 - 1.2 mg/dL   GFR calc non Af Amer >60 >60 mL/min   GFR calc Af Amer >60 >60 mL/min   Anion gap 10 5 - 15    Comment: Performed at [facility], [address], [place], [place] [address]  Lipase, blood     Status: None   Collection Time: 03/20/19  5:25 PM  Result Value Ref Range   Lipase 26 11 - 51 U/L    Comment: Performed at [facility], [address], [place], [place] [address]  I-Stat beta hCG blood, ED     Status: None   Collection Time: 03/20/19  5:29 PM  Result Value Ref Range   I-stat hCG,  quantitative <5.0 <5 mIU/mL   Comment 3            Comment:   GEST. AGE      CONC.  (mIU/mL)   <=1 WEEK        5 - 50     2 WEEKS       50 - 500     3 WEEKS       100 - 10,000     4 WEEKS     1,000 - 30,000        FEMALE AND NON-PREGNANT FEMALE:     LESS THAN 5 mIU/mL   Urinalysis, Routine w reflex microscopic     Status: None   Collection Time: 03/20/19  5:45 PM  Result Value Ref Range   Color, Urine YELLOW YELLOW   APPearance CLEAR CLEAR   Specific Gravity, Urine 1.018 1.005 - 1.030   pH 7.0 5.0 - 8.0   Glucose, UA NEGATIVE NEGATIVE mg/dL   Hgb urine dipstick NEGATIVE NEGATIVE   Bilirubin Urine NEGATIVE NEGATIVE   Ketones, ur NEGATIVE NEGATIVE mg/dL   Protein, ur NEGATIVE NEGATIVE mg/dL   Nitrite NEGATIVE NEGATIVE   Leukocytes,Ua NEGATIVE NEGATIVE    Comment: Performed at [facility], [address] [place], [place] [address]   US Transvaginal Non-ob  Result Date: 03/20/2019 CLINICAL DATA:  Initial evaluation for pelvic mass and bleeding seen on prior CT. EXAM: TRANSABDOMINAL AND TRANSVAGINAL ULTRASOUND OF PELVIS DOPPLER ULTRASOUND OF OVARIES TECHNIQUE: Both transabdominal and transvaginal ultrasound examinations of the pelvis were performed. Transabdominal technique was performed for global imaging of the pelvis including uterus, ovaries, adnexal regions, and pelvic cul-de-sac. It was necessary to proceed with endovaginal exam following the transabdominal exam to visualize the uterus, endometrium, and ovaries. Color and duplex Doppler ultrasound was utilized to evaluate blood flow to the ovaries. COMPARISON:  Prior CT from earlier the same day as well as previous ultrasound from 02/14/2009. FINDINGS: Uterus Measurements: 8.8 x 3.4 x 4.2 cm = volume: 64.4 mL. No fibroids or other mass visualized. Endometrium IUD in place within the endometrial cavity, limiting assessment. No focal abnormality. Right ovary Measurements: 4.5 x 3.0 x 2.9 cm =  volume: 20.4 mL. Normal appearance/no  adnexal mass. Left ovary Complex left adnexal mass measuring 11.6 x 6.3 x 7.5 cm, corresponding with abnormality seen on prior CT. Lesion demonstrates mixed echogenicity, suspected to in large part reflect blood products given appearance on prior CT. Lesion likely arises from the left ovary, with some ovarian tissue appearing at the margin of this lesion. Scattered areas of internal vascularity seen, which could be within the left ovary itself. Previously seen active hemorrhage not visible by ultrasound. Pulsed Doppler evaluation of the right ovary demonstrates normal low-resistance arterial and venous waveforms. Normal arterial and venous waveforms also seen within the left adnexal lesion, favored to be within the left ovary. Other findings Moderate complex fluid seen within the pelvis, which could reflect blood. IMPRESSION: 1. 11.6 cm complex left adnexal/pelvic mass, corresponding with abnormality seen on prior CT. Finding is indeterminate, and could reflect a ruptured and/or complex cyst with associated hematoma formation, an endometrioma which has bled, or possibly an underlying mass/malignancy with associated hemorrhage. Ruptured ectopic pregnancy is excluded given the negative pregnancy test. Gynecologic referral for surgical consultation recommended. Additionally, further assessment with dedicated pelvic MRI, with and without contrast, could be performed for further evaluation as clinically desired. 2. No evidence for ovarian torsion. Areas of preserved arterial and venous Doppler waveform are seen within the left pelvic mass, favored to lie within the underlying left ovary. 3. Associated moderate volume complex free fluid within the pelvis, likely blood products. 4. IUD in place within the uterus. Electronically Signed   By: Rise Mu M.D.   On: 03/20/2019 21:12   US Pelvis Complete  Result Date: 03/20/2019 CLINICAL DATA:  Initial evaluation for pelvic mass and bleeding seen on prior CT.  EXAM: TRANSABDOMINAL AND TRANSVAGINAL ULTRASOUND OF PELVIS DOPPLER ULTRASOUND OF OVARIES TECHNIQUE: Both transabdominal and transvaginal ultrasound examinations of the pelvis were performed. Transabdominal technique was performed for global imaging of the pelvis including uterus, ovaries, adnexal regions, and pelvic cul-de-sac. It was necessary to proceed with endovaginal exam following the transabdominal exam to visualize the uterus, endometrium, and ovaries. Color and duplex Doppler ultrasound was utilized to evaluate blood flow to the ovaries. COMPARISON:  Prior CT from earlier the same day as well as previous ultrasound from 02/14/2009. FINDINGS: Uterus Measurements: 8.8 x 3.4 x 4.2 cm = volume: 64.4 mL. No fibroids or other mass visualized. Endometrium IUD in place within the endometrial cavity, limiting assessment. No focal abnormality. Right ovary Measurements: 4.5 x 3.0 x 2.9 cm = volume: 20.4 mL. Normal appearance/no adnexal mass. Left ovary Complex left adnexal mass measuring 11.6 x 6.3 x 7.5 cm, corresponding with abnormality seen on prior CT. Lesion demonstrates mixed echogenicity, suspected to in large part reflect blood products given appearance on prior CT. Lesion likely arises from the left ovary, with some ovarian tissue appearing at the margin of this lesion. Scattered areas of internal vascularity seen, which could be within the left ovary itself. Previously seen active hemorrhage not visible by ultrasound. Pulsed Doppler evaluation of the right ovary demonstrates normal low-resistance arterial and venous waveforms. Normal arterial and venous waveforms also seen within the left adnexal lesion, favored to be within the left ovary. Other findings Moderate complex fluid seen within the pelvis, which could reflect blood. IMPRESSION: 1. 11.6 cm complex left adnexal/pelvic mass, corresponding with abnormality seen on prior CT. Finding is indeterminate, and could reflect a ruptured and/or complex cyst  with associated hematoma formation, an endometrioma which has bled, or possibly an underlying mass/malignancy  with associated hemorrhage. Ruptured ectopic pregnancy is excluded given the negative pregnancy test. Gynecologic referral for surgical consultation recommended. Additionally, further assessment with dedicated pelvic MRI, with and without contrast, could be performed for further evaluation as clinically desired. 2. No evidence for ovarian torsion. Areas of preserved arterial and venous Doppler waveform are seen within the left pelvic mass, favored to lie within the underlying left ovary. 3. Associated moderate volume complex free fluid within the pelvis, likely blood products. 4. IUD in place within the uterus. Electronically Signed   By: Rise Mu M.D.   On: 03/20/2019 21:12   Ct Abdomen Pelvis W Contrast  Result Date: 03/20/2019 CLINICAL DATA:  Acute generalized abdominal pain for 1 day. Dysuria. Negative serum pregnancy test. EXAM: CT ABDOMEN AND PELVIS WITH CONTRAST TECHNIQUE: Multidetector CT imaging of the abdomen and pelvis was performed using the standard protocol following bolus administration of intravenous contrast. CONTRAST:  OMNIPAQUE IOHEXOL 300 MG/ML  SOLN COMPARISON:  Abdominopelvic CT 02/12/2009. Pelvic ultrasound 02/14/2009. FINDINGS: Lower chest: Clear lung bases. No significant pleural or pericardial effusion. Hepatobiliary: The liver is normal in density without suspicious focal abnormality. There are several nitrogen containing gallstones. There is minimal gallbladder wall thickening without surrounding inflammation. There is no biliary dilatation. Pancreas: Unremarkable. No pancreatic ductal dilatation or surrounding inflammatory changes. Spleen: Normal in size without focal abnormality. Adrenals/Urinary Tract: Both adrenal glands appear normal. The kidneys, ureters and bladder appear normal. There is no evidence of urinary tract calculus or hydronephrosis.  Stomach/Bowel: No evidence of bowel wall thickening, distention or surrounding inflammatory change. The appendix is not confidently identified, although there is no evidence of pericecal inflammation. Vascular/Lymphatic: There are no enlarged abdominal or pelvic lymph nodes. No significant vascular findings in the abdomen. See reproductive section below. Reproductive: Intrauterine device is in place. The uterus is anteriorly displaced by a complex soft tissue mass in the posterior pelvis measuring approximately 10.1 x 6.4 x 9.4 cm. There are prominent vessels surrounding this mass with possible active internal bleeding (images 66-68 of series 3). There was a smaller, less complex appearing mass in this area on the remote pelvic CT. The ovaries are not confidently identified separate from this pelvic process. Other: There is a small to moderate amount free peritoneal fluid which measures slightly higher than water density. There is no layering high density within this fluid. Intact anterior abdominal wall. Musculoskeletal: No acute or significant osseous findings. IMPRESSION: 1. Large atypical complex mass in the pelvic cul-de-sac with suggestion of active bleeding. Some of this mass may reflect hematoma. Given the presence a smaller less complex mass in this area previously, the possibility of endometriosis is raised. Ruptured ectopic pregnancy is excluded by the negative pregnancy test. Malignancy is unlikely in this age group. 2. Intermediate density ascites, possibly hemoperitoneum. 3. Surgical evaluation recommended. 4. Cholelithiasis without evidence of cholecystitis. 5. These results were called by telephone at the time of interpretation on 03/20/2019 at 7:21 pm to [facility] , who verbally acknowledged these results. Electronically Signed   By: Carey Bullocks M.D.   On: 03/20/2019 19:23   [place] Art/ven Flow Abd Pelv Doppler  Result Date: 03/20/2019 CLINICAL DATA:  Initial evaluation for pelvic mass and  bleeding seen on prior CT. EXAM: TRANSABDOMINAL AND TRANSVAGINAL ULTRASOUND OF PELVIS DOPPLER ULTRASOUND OF OVARIES TECHNIQUE: Both transabdominal and transvaginal ultrasound examinations of the pelvis were performed. Transabdominal technique was performed for global imaging of the pelvis including uterus, ovaries, adnexal regions, and pelvic cul-de-sac. It was necessary  to proceed with endovaginal exam following the transabdominal exam to visualize the uterus, endometrium, and ovaries. Color and duplex Doppler ultrasound was utilized to evaluate blood flow to the ovaries. COMPARISON:  Prior CT from earlier the same day as well as previous ultrasound from 02/14/2009. FINDINGS: Uterus Measurements: 8.8 x 3.4 x 4.2 cm = volume: 64.4 mL. No fibroids or other mass visualized. Endometrium IUD in place within the endometrial cavity, limiting assessment. No focal abnormality. Right ovary Measurements: 4.5 x 3.0 x 2.9 cm = volume: 20.4 mL. Normal appearance/no adnexal mass. Left ovary Complex left adnexal mass measuring 11.6 x 6.3 x 7.5 cm, corresponding with abnormality seen on prior CT. Lesion demonstrates mixed echogenicity, suspected to in large part reflect blood products given appearance on prior CT. Lesion likely arises from the left ovary, with some ovarian tissue appearing at the margin of this lesion. Scattered areas of internal vascularity seen, which could be within the left ovary itself. Previously seen active hemorrhage not visible by ultrasound. Pulsed Doppler evaluation of the right ovary demonstrates normal low-resistance arterial and venous waveforms. Normal arterial and venous waveforms also seen within the left adnexal lesion, favored to be within the left ovary. Other findings Moderate complex fluid seen within the pelvis, which could reflect blood. IMPRESSION: 1. 11.6 cm complex left adnexal/pelvic mass, corresponding with abnormality seen on prior CT. Finding is indeterminate, and could reflect a  ruptured and/or complex cyst with associated hematoma formation, an endometrioma which has bled, or possibly an underlying mass/malignancy with associated hemorrhage. Ruptured ectopic pregnancy is excluded given the negative pregnancy test. Gynecologic referral for surgical consultation recommended. Additionally, further assessment with dedicated pelvic MRI, with and without contrast, could be performed for further evaluation as clinically desired. 2. No evidence for ovarian torsion. Areas of preserved arterial and venous Doppler waveform are seen within the left pelvic mass, favored to lie within the underlying left ovary. 3. Associated moderate volume complex free fluid within the pelvis, likely blood products. 4. IUD in place within the uterus. Electronically Signed   By: Rise Mu M.D.   On: 03/20/2019 21:12    Pending Labs Unresulted Labs (From admission, onward)    Start     Ordered   03/20/19 2153  hCG, quantitative, pregnancy  Once,   R    Comments:  (Patient will need to arrive 2 hours preoperatively if lab same day).    03/20/19 2152   03/20/19 2153  Type and screen [facility]  ONCE - STAT,   STAT    Comments:  [facility]    03/20/19 2152          Vitals/Pain Today's Vitals   03/20/19 2013 03/20/19 2020 03/20/19 2116 03/20/19 2118  BP:  (!) 145/129  123/78  Pulse:  72  75  Resp:  16  14  Temp:      TempSrc:      SpO2:    95%  Weight:      Height:      PainSc: 10-Worst pain ever  6      Isolation Precautions No active isolations  Medications Medications  ceFAZolin (ANCEF) IVPB 2g/100 mL premix (has no administration in time range)  metoCLOPramide (REGLAN) injection 10 mg (10 mg Intravenous Given 03/20/19 1720)  HYDROmorphone (DILAUDID) injection 0.5 mg (0.5 mg Intravenous Given 03/20/19 1720)  iohexol (OMNIPAQUE) 300 MG/ML solution 100 mL (100 mLs Intravenous Contrast Given 03/20/19 1824)  HYDROmorphone (DILAUDID)  injection 0.5 mg (0.5 mg  Intravenous Given 03/20/19 2014)    Mobility walks High fall risk   Focused Assessments    R Recommendations: See Admitting Provider Note  Report given to:   Additional Notes:

## 2019-03-21 ENCOUNTER — Encounter (HOSPITAL_COMMUNITY): Payer: Self-pay | Admitting: Obstetrics & Gynecology

## 2019-03-21 ENCOUNTER — Other Ambulatory Visit: Payer: Self-pay | Admitting: Obstetrics & Gynecology

## 2019-03-21 DIAGNOSIS — Z9889 Other specified postprocedural states: Secondary | ICD-10-CM

## 2019-03-21 LAB — ABO/RH: ABO/RH(D): A POS

## 2019-03-21 MED ORDER — HYDROMORPHONE HCL 1 MG/ML IJ SOLN
0.2500 mg | INTRAMUSCULAR | Status: DC | PRN
  Administered 2019-03-21 (×2): 0.5 mg via INTRAVENOUS

## 2019-03-21 MED ORDER — OXYCODONE HCL 5 MG/5ML PO SOLN: 5.0000 mg | Freq: Once | ORAL | Status: DC | PRN

## 2019-03-21 MED ORDER — ONDANSETRON HCL 4 MG/2ML IJ SOLN
INTRAMUSCULAR | Status: DC | PRN
  Administered 2019-03-21: 4 mg via INTRAVENOUS

## 2019-03-21 MED ORDER — PROMETHAZINE HCL 25 MG/ML IJ SOLN
INTRAMUSCULAR | Status: AC
  Filled 2019-03-21: qty 1

## 2019-03-21 MED ORDER — PROMETHAZINE HCL 25 MG/ML IJ SOLN
6.2500 mg | INTRAMUSCULAR | Status: DC | PRN
  Administered 2019-03-21: 6.25 mg via INTRAVENOUS

## 2019-03-21 MED ORDER — OXYCODONE-ACETAMINOPHEN 5-325 MG PO TABS
1.0000 | ORAL_TABLET | ORAL | Status: DC | PRN
  Filled 2019-03-21 (×2): qty 2

## 2019-03-21 MED ORDER — ONDANSETRON HCL 4 MG PO TABS: 4.0000 mg | ORAL_TABLET | Freq: Four times a day (QID) | ORAL | 0 refills | Status: DC | PRN

## 2019-03-21 MED ORDER — KETOROLAC TROMETHAMINE 30 MG/ML IJ SOLN: 30.0000 mg | Freq: Once | INTRAMUSCULAR | Status: DC | PRN

## 2019-03-21 MED ORDER — MEPERIDINE HCL 50 MG/ML IJ SOLN
INTRAMUSCULAR | Status: AC
  Filled 2019-03-21: qty 1

## 2019-03-21 MED ORDER — HYDROCODONE-ACETAMINOPHEN 5-325 MG PO TABS: 1.0000 | ORAL_TABLET | Freq: Four times a day (QID) | ORAL | 0 refills | Status: DC | PRN

## 2019-03-21 MED ORDER — MEPERIDINE HCL 50 MG/ML IJ SOLN
6.2500 mg | INTRAMUSCULAR | Status: DC | PRN
  Administered 2019-03-21: 12.5 mg via INTRAVENOUS

## 2019-03-21 MED ORDER — HYDROMORPHONE HCL 1 MG/ML IJ SOLN
INTRAMUSCULAR | Status: AC
  Administered 2019-03-21: 0.5 mg via INTRAVENOUS
  Filled 2019-03-21: qty 1

## 2019-03-21 MED ORDER — ONDANSETRON HCL 4 MG/2ML IJ SOLN: 4.0000 mg | Freq: Four times a day (QID) | INTRAMUSCULAR | Status: DC | PRN

## 2019-03-21 MED ORDER — ONDANSETRON HCL 4 MG PO TABS: 4.0000 mg | ORAL_TABLET | Freq: Four times a day (QID) | ORAL | Status: DC | PRN

## 2019-03-21 MED ORDER — OXYCODONE HCL 5 MG PO TABS: 5.0000 mg | ORAL_TABLET | Freq: Once | ORAL | Status: DC | PRN

## 2019-03-21 MED ORDER — FENTANYL CITRATE (PF) 100 MCG/2ML IJ SOLN
50.0000 ug | INTRAMUSCULAR | Status: DC | PRN
  Administered 2019-03-21 (×4): 100 ug via INTRAVENOUS
  Filled 2019-03-21 (×4): qty 2

## 2019-03-21 NOTE — Anesthesia Postprocedure Evaluation (Signed)
Anesthesia Post Note  Patient: Cheryl Munoz  Procedure(s) Performed: LAPAROSCOPIC SALPINGO OOPHORECTOMY (Left Abdomen)     Patient location during evaluation: PACU Anesthesia Type: General Level of consciousness: awake and alert Pain management: pain level controlled Vital Signs Assessment: post-procedure vital signs reviewed and stable Respiratory status: spontaneous breathing, nonlabored ventilation, respiratory function stable and patient connected to nasal cannula oxygen Cardiovascular status: blood pressure returned to baseline and stable Postop Assessment: no apparent nausea or vomiting Anesthetic complications: no    Last Vitals:  Vitals:   03/21/19 0058 03/21/19 0344  BP: 130/62 107/61  Pulse: 72 (!) 56  Resp: 18 16  Temp: (!) 36.4 C (!) 36.3 C  SpO2: 97% 93%    Last Pain:  Vitals:   03/21/19 0526  TempSrc:   PainSc: 2                  Ryan P Ellender

## 2019-03-21 NOTE — Discharge Instructions (Signed)
Laparoscopic removal of ovary and tube, Care After Refer to this sheet in the next few weeks. These instructions provide you with information about caring for yourself after your procedure. Your health care provider may also give you more specific instructions. Your treatment has been planned according to current medical practices, but problems sometimes occur. Call your health care provider if you have any problems or questions after your procedure. What can I expect after the procedure? After the procedure, it is common to have:  A sore throat.  Discomfort in your shoulder.  Mild discomfort or cramping in your abdomen.  Gas pains.  Pain or soreness in the area where the surgical cut (incision) was made.  A bloated feeling.  Tiredness.  Nausea.  Vomiting. Follow these instructions at home: Medicines  Take over-the-counter and prescription medicines only as told by your health care provider.  Do not take aspirin because it can cause bleeding.  Do not drive or operate heavy machinery while taking prescription pain medicine. Activity  Rest for the rest of the day.  Return to your normal activities as told by your health care provider. Ask your health care provider what activities are safe for you. Incision care      Follow instructions from your health care provider about how to take care of your incision. Make sure you: ? Wash your hands with soap and water before you change your bandage (dressing). If soap and water are not available, use hand sanitizer. ? Change your dressing as told by your health care provider. ? Leave stitches (sutures) in place. They may need to stay in place for 2 weeks or longer.  Check your incision area every day for signs of infection. Check for: ? More redness, swelling, or pain. ? More fluid or blood. ? Warmth. ? Pus or a bad smell. Other Instructions  Do not take baths, swim, or use a hot tub until your health care provider approves.  You may take showers.  Keep all follow-up visits as told by your health care provider. This is important.  Have someone help you with your daily household tasks for the first few days. Contact a health care provider if:  You have more redness, swelling, or pain around your incision.  Your incision feels warm to the touch.  You have pus or a bad smell coming from your incision.  The edges of your incision break open after the sutures have been removed.  Your pain does not improve after 2-3 days.  You have a rash.  You repeatedly become dizzy or light-headed.  Your pain medicine is not helping.  You are constipated. Get help right away if:  You have a fever.  You faint.  You have increasing pain in your abdomen.  You have severe pain in one or both of your shoulders.  You have fluid or blood coming from your sutures or from your vagina.  You have shortness of breath or difficulty breathing.  You have chest pain or leg pain.  You have ongoing nausea, vomiting, or diarrhea. This information is not intended to replace advice given to you by your health care provider. Make sure you discuss any questions you have with your health care provider. Document Released: 05/30/2005 Document Revised: 07/07/2017 Document Reviewed: 10/21/2015 Elsevier Interactive Patient Education  2019 [employer].

## 2019-03-21 NOTE — Transfer of Care (Signed)
Immediate Anesthesia Transfer of Care Note  Patient: Cheryl Munoz  Procedure(s) Performed: LAPAROSCOPIC SALPINGO OOPHORECTOMY (Left Abdomen)  Patient Location: PACU  Anesthesia Type:General  Level of Consciousness: oriented, drowsy, patient cooperative and responds to stimulation  Airway & Oxygen Therapy: Patient Spontanous Breathing and Patient connected to face mask oxygen  Post-op Assessment: Report given to RN, Post -op Vital signs reviewed and stable and Patient moving all extremities X 4  Post vital signs: Reviewed and stable  Last Vitals:  Vitals Value Taken Time  BP    Temp 36.2 C 03/21/2019 12:12 AM  Pulse 90 03/21/2019 12:12 AM  Resp    SpO2      Last Pain:  Vitals:   03/20/19 2116  TempSrc:   PainSc: 6       Patients Stated Pain Goal: 0 (03/20/19 2013)  Complications: No apparent anesthesia complications

## 2019-03-21 NOTE — Op Note (Signed)
Preoperative diagnosis:  1.  Left ovarian mass/hematoma                                         2.  Acute left lower quadrant pain  Postoperative diagnosis:  Same as above + 500 cc hemoperitoneum  Procedure:  Laparoscopic left salpingo-oophorectomy  Surgeon:  Rockne Coons MD  Anesthesia:  Gen. Endotracheal  Findings:  The patient presented with acute LLQ pain and scans found left ovarian mass/hematoma.    Intraoperatively,there was 500 cc hemoperitoneum without any active bleeding, mostly clot some fresh blood.  Ovary appeared geographic but otherwise normal.  Must have been a rupture of a cyst.     Description of operation:  The patient was taken to the operating room and placed in the supine position where she underwent general endotracheal anesthesia.  She was placed in the low lithotomy position.  She was prepped and draped in the usual sterile fashion and a Foley catheter was placed.  An incision was made in the umbilicus and a Veres needle was placed into the peritoneal cavity with one pass without difficulty.  The peritoneal cavity was then insufflated.  An 11 mm non-bladed direct visualization trocar was then used and placed into the peritoneal cavity with direct laparoscopic visualization again without difficulty.  Incisions were then made in the left lower quadrant and right lower quadrant.  A 5 mm trocar was placed in the left lower quadrant and a 5 mm trocar was placed in the right lower quadrant.  Both were done under direct visualization without difficulty.  There was a large amount of blood in pelvis filling the cul de sac and spilling well out of it.  Much time was spent removing the blood and clots. The left ovary and tube were grasped.  The harmonic scalpel was used and the infundibulopelvic ligament on the left was coagulated and then transected.  There was good hemostasis.    The right ovary was normal.   An Endo Catch was placed in the left tube and ovary were put in the bag  and removed through the umbilical incision without difficulty.  .  The umbilical fascia was closed with a 3 interrupted 0 Vicryl suture.  The subcutaneous tissue was reapproximated loosely using 0 Vicryl.  The skin was closed using skin staples x 3.   The patient was awakened from anesthesia and taken to the recovery room in good stable condition with all counts being correct x3.  The estimated blood loss for the procedure was 0 cc hemoperitoneum approximately 500 cc.   There was no bleeding from the intraperitoneal surgery at all.  The patient received Ancef preoperatively.    Lazaro Arms, MD 03/21/2019 12:11 AM

## 2019-03-21 NOTE — Discharge Summary (Signed)
Physician Discharge Summary  Patient ID: Cheryl Munoz: [id number] DOB/AGE: [birth year] 34 y.o.  Admit date: 03/20/2019 Discharge date: 03/21/2019  Admission Diagnoses:s/p lapararoscopic LSO and evacuarion of pelvic hematoma  Discharge Diagnoses:  Active Problems:   Ovarian mass, left   Postoperative state   Discharged Condition: good  Hospital Course: unremarkable  Consults: None  Significant Diagnostic Studies:   Treatments: surgery: lp LSO   Discharge Exam: Blood pressure 107/61, pulse (!) 56, temperature (!) 97.4 F (36.3 C), temperature source Oral, resp. rate 16, height 5\' 3"  (1.6 m), weight 52.2 kg, SpO2 93 %. General appearance: alert, cooperative and no distress GI: soft, non-tender; bowel sounds normal; no masses,  no organomegaly  Disposition: Discharge disposition: 01-Home or Self Care       Discharge Instructions     Remove dressing in 72 hours   Complete by:  As directed    Call MD for:  persistant nausea and vomiting   Complete by:  As directed    Call MD for:  severe uncontrolled pain   Complete by:  As directed    Call MD for:  temperature >100.4   Complete by:  As directed    Diet - low sodium heart healthy   Complete by:  As directed    Increase activity slowly   Complete by:  As directed    Sexual Activity Restrictions   Complete by:  As directed    No sex for 2 weeks     Allergies as of 03/21/2019   No Known Allergies     Medication List    TAKE these medications   acetaminophen 500 MG tablet Commonly known as:  TYLENOL Take 1,000 mg by mouth every 6 (six) hours as needed for moderate pain or headache.   cetirizine 10 MG tablet Commonly known as:  ZYRTEC Take 10 mg by mouth as needed for allergies.   HYDROcodone-acetaminophen 5-325 MG tablet Commonly known as:  NORCO/VICODIN Take 1 tablet by mouth every 6 (six) hours as needed.   ibuprofen 200 MG tablet Commonly known as:  ADVIL Take 400 mg by mouth every 6 (six) hours  as needed for headache or mild pain.   naproxen sodium 220 MG tablet Commonly known as:  ALEVE Take 440 mg by mouth as needed (pain).   ondansetron 4 MG tablet Commonly known as:  ZOFRAN Take 1 tablet (4 mg total) by mouth every 6 (six) hours as needed for nausea.   Xyzal 5 MG tablet Generic drug:  levocetirizine Take 5 mg by mouth as needed for allergies.      Follow-up Information    Lazaro Arms, MD Follow up in 1 week(s).   Specialties:  Obstetrics and Gynecology, Radiology Why:  post op visit Contact information: [address] [place] [address] [phone]           Signed: Lazaro Arms 03/21/2019, 9:27 AM

## 2019-03-21 NOTE — Progress Notes (Signed)
Went over discharge instructions with pt. PT states she understands all instructions. Removed IV from pt right AC.

## 2019-03-28 ENCOUNTER — Telehealth: Payer: Self-pay | Admitting: Obstetrics & Gynecology

## 2019-03-28 NOTE — Telephone Encounter (Signed)
Pt states that she is needing an appt to have her staples removed but she is unsure of when she is to come in for this.

## 2019-03-28 NOTE — Telephone Encounter (Signed)
Per chart review patient needs appt this week with Dr. Despina Hidden for postop.

## 2019-03-29 ENCOUNTER — Ambulatory Visit (INDEPENDENT_AMBULATORY_CARE_PROVIDER_SITE_OTHER): Payer: Self-pay | Admitting: Obstetrics & Gynecology

## 2019-03-29 ENCOUNTER — Other Ambulatory Visit: Payer: Self-pay

## 2019-03-29 ENCOUNTER — Encounter: Payer: Self-pay | Admitting: Obstetrics & Gynecology

## 2019-03-29 VITALS — BP 131/79 | HR 67 | Ht 63.0 in | Wt 150.0 lb

## 2019-03-29 DIAGNOSIS — Z9889 Other specified postprocedural states: Secondary | ICD-10-CM

## 2019-03-29 NOTE — Progress Notes (Signed)
  HPI: Patient returns for routine postoperative follow-up having undergone laparoscopic LSO on 03/20/2019.  The patient's immediate postoperative recovery has been unremarkable. Since hospital discharge the patient reports doing well.   Current Outpatient Medications: acetaminophen (TYLENOL) 500 MG tablet, Take 1,000 mg by mouth every 6 (six) hours as needed for moderate pain or headache., Disp: , Rfl:  cetirizine (ZYRTEC) 10 MG tablet, Take 10 mg by mouth as needed for allergies., Disp: , Rfl:  HYDROcodone-acetaminophen (NORCO/VICODIN) 5-325 MG tablet, Take 1 tablet by mouth every 6 (six) hours as needed., Disp: 30 tablet, Rfl: 0 ibuprofen (ADVIL) 200 MG tablet, Take 400 mg by mouth every 6 (six) hours as needed for headache or mild pain., Disp: , Rfl:  naproxen sodium (ALEVE) 220 MG tablet, Take 440 mg by mouth as needed (pain)., Disp: , Rfl:   No current facility-administered medications for this visit.     Blood pressure 131/79, pulse 67, height 5\' 3"  (1.6 m), weight 150 lb (68 kg), last menstrual period 02/27/2019.  Physical Exam: Incision x 3 all look good healing well  Diagnostic Tests:   Pathology: bening  Impression: S/p laparoscopic LSO for rupture ovarian cyst and 500 cc hemoperitoneum  Plan:   Follow up: prn    Lazaro Arms, MD

## 2019-06-24 ENCOUNTER — Other Ambulatory Visit: Payer: Self-pay

## 2019-06-24 DIAGNOSIS — Z20822 Contact with and (suspected) exposure to covid-19: Secondary | ICD-10-CM

## 2019-06-26 LAB — NOVEL CORONAVIRUS, NAA: SARS-CoV-2, NAA: NOT DETECTED

## 2019-06-28 ENCOUNTER — Telehealth: Payer: Self-pay | Admitting: General Practice

## 2019-06-28 NOTE — Telephone Encounter (Signed)
Negative results was given to pt °

## 2019-08-15 IMAGING — CT CT ABDOMEN AND PELVIS WITH CONTRAST
2 of 4 series · 15 of 46 positions shown, 17 images · IV contrast (APPLIED)
Comparison: Abdominopelvic CT 02/12/2009. Pelvic ultrasound
02/14/2009.

CLINICAL DATA: Acute generalized abdominal pain for 1 day. Dysuria.
Negative serum pregnancy test.

EXAM:
CT ABDOMEN AND PELVIS WITH CONTRAST
TECHNIQUE: Multidetector CT imaging of the abdomen and pelvis was performed
using the standard protocol following bolus administration of
intravenous contrast.
CONTRAST:  100mL OMNIPAQUE IOHEXOL 300 MG/ML  SOLN

[Series 3: abdomen 5.0 · axial · 0.93mm/px · z∈[+737,+1142]mm · 12 of 93 slices shown, 14 images]
[im 6/93  soft-tissue]
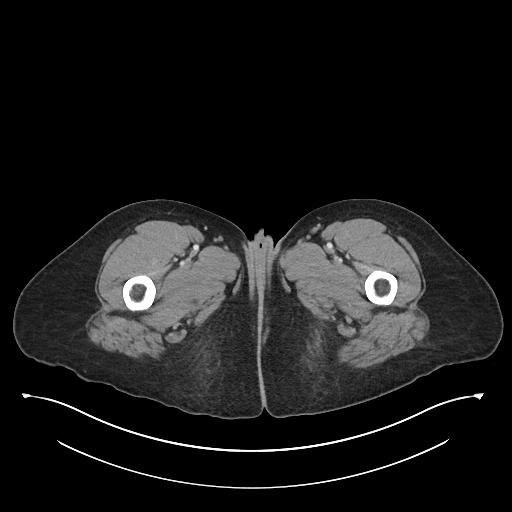
[im 6/93  bone]
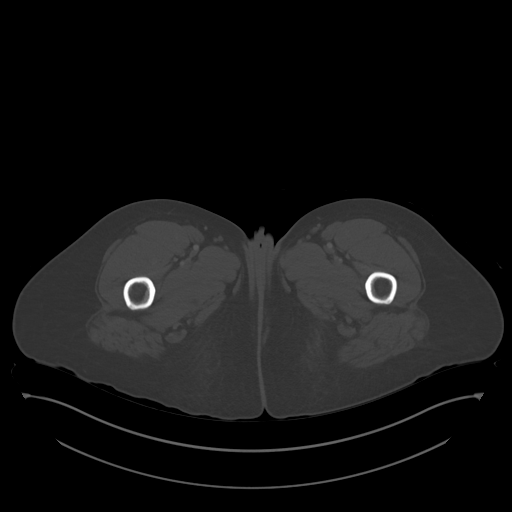
[im 16/93  soft-tissue]
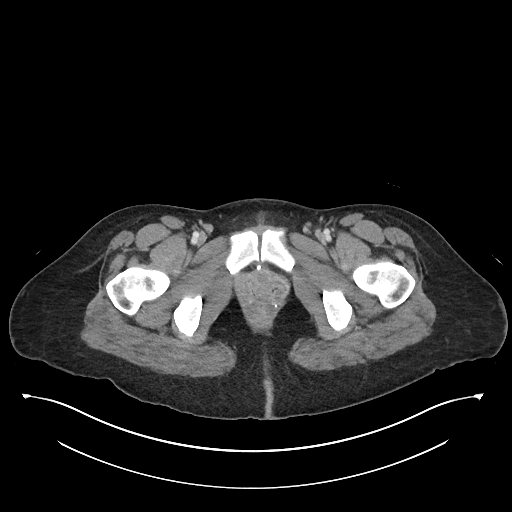
[im 21/93  soft-tissue]
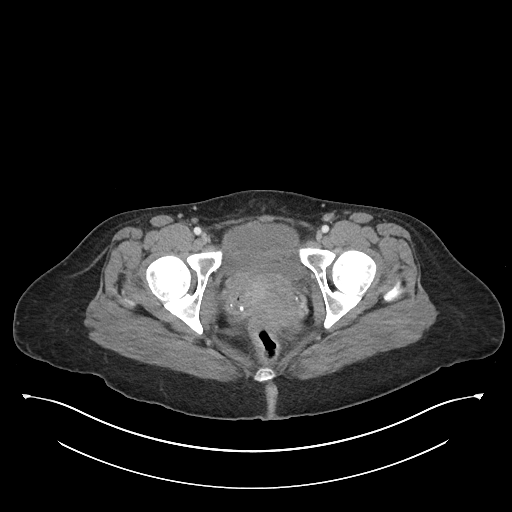
[im 26/93  soft-tissue]
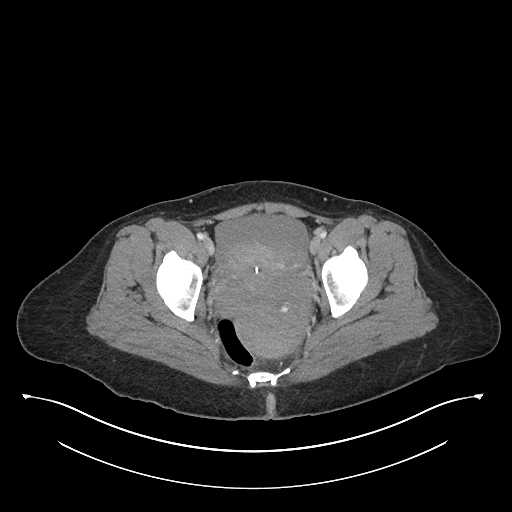
[im 36/93  soft-tissue]
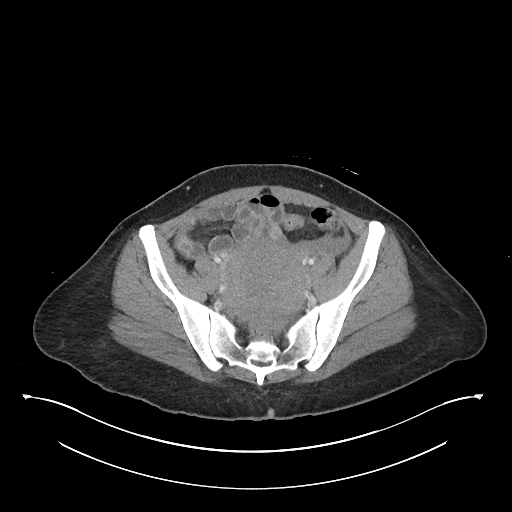
[im 41/93  soft-tissue]
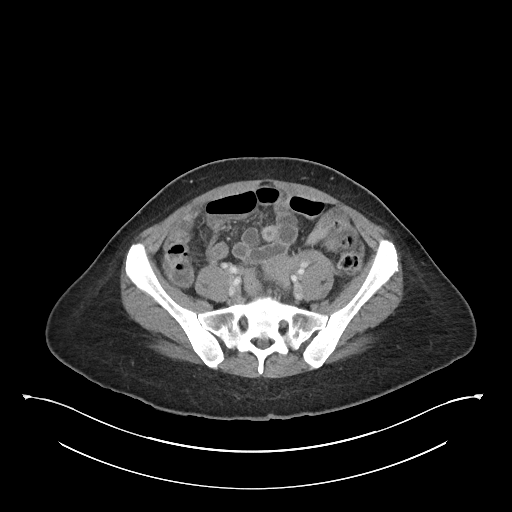
[im 52/93  soft-tissue]
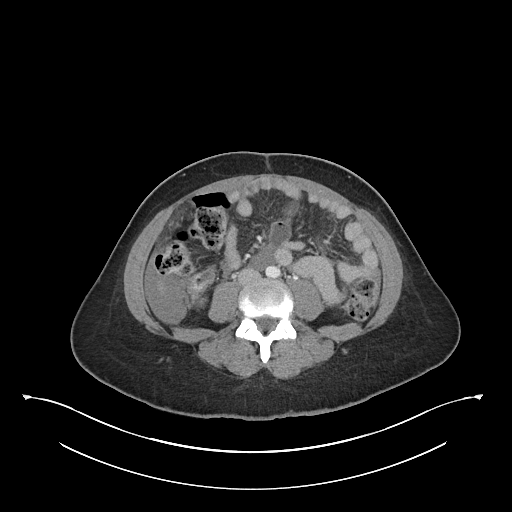
[im 57/93  soft-tissue]
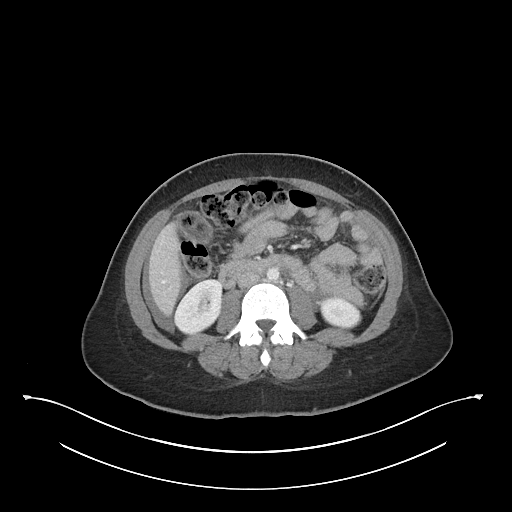
[im 67/93  soft-tissue]
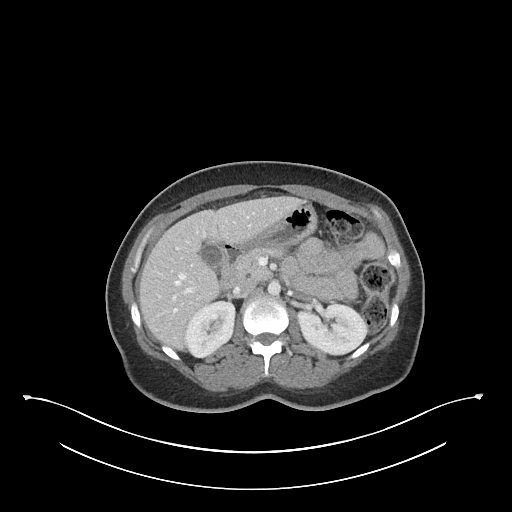
[im 67/93  bone]
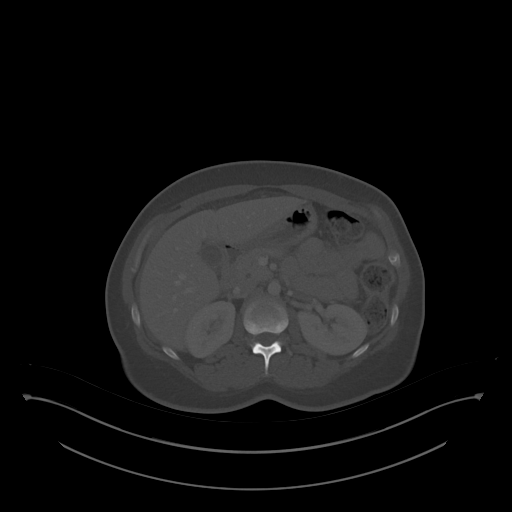
[im 72/93  soft-tissue]
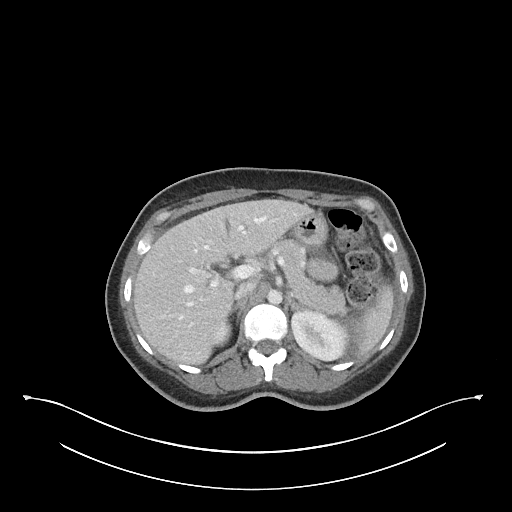
[im 77/93  soft-tissue]
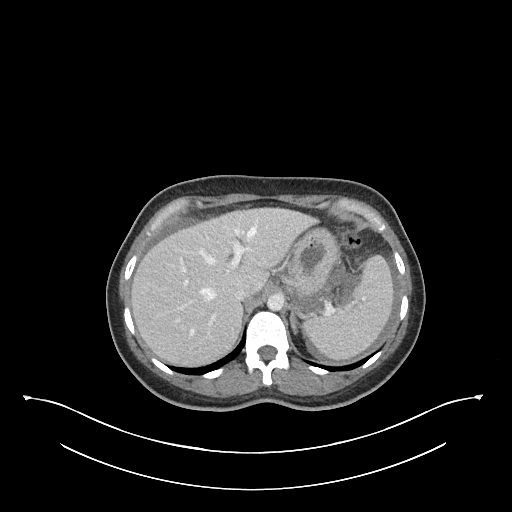
[im 87/93  soft-tissue]
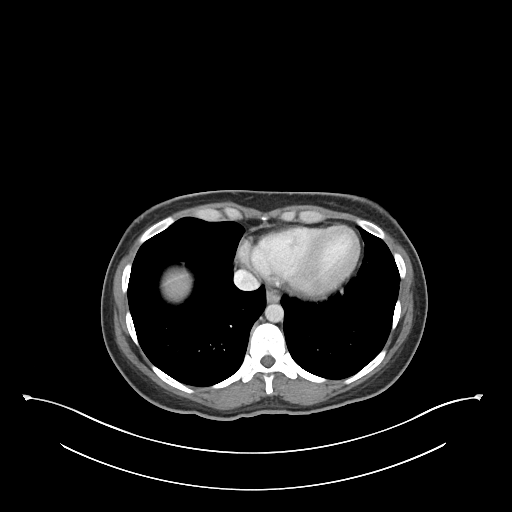

[Series 6: abdomen 3.0 mpr cor · coronal · 0.83mm/px · 3 of 97 slices shown]
[im 33/97  soft-tissue]
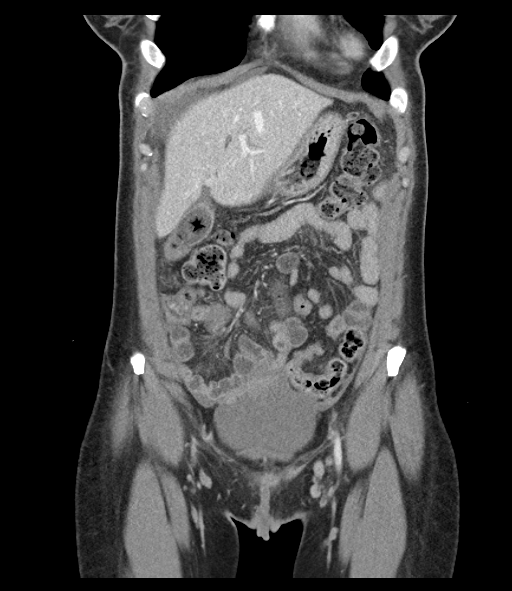
[im 43/97  soft-tissue]
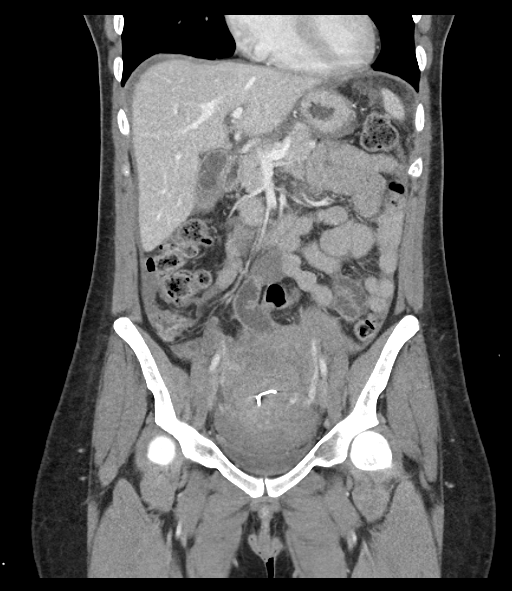
[im 54/97  soft-tissue]
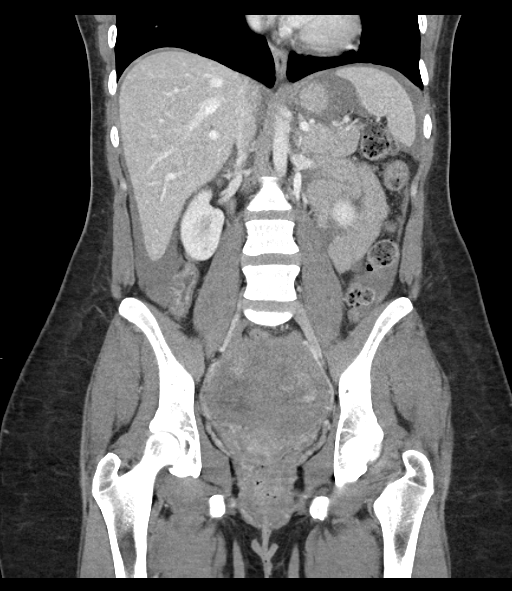

[15 of 46 positions shown; findings below may reference images not displayed]

FINDINGS: Lower chest: Clear lung bases. No significant pleural or pericardial
effusion.

Hepatobiliary: The liver is normal in density without suspicious
focal abnormality. There are several nitrogen containing gallstones.
There is minimal gallbladder wall thickening without surrounding
inflammation. There is no biliary dilatation.

Pancreas: Unremarkable. No pancreatic ductal dilatation or
surrounding inflammatory changes.

Spleen: Normal in size without focal abnormality.

Adrenals/Urinary Tract: Both adrenal glands appear normal. The
kidneys, ureters and bladder appear normal. There is no evidence of
urinary tract calculus or hydronephrosis.

Stomach/Bowel: No evidence of bowel wall thickening, distention or
surrounding inflammatory change. The appendix is not confidently
identified, although there is no evidence of pericecal inflammation.

Vascular/Lymphatic: There are no enlarged abdominal or pelvic lymph
nodes. No significant vascular findings in the abdomen. See
reproductive section below.

Reproductive: Intrauterine device is in place. The uterus is
anteriorly displaced by a complex soft tissue mass in the posterior
pelvis measuring approximately 10.1 x 6.4 x 9.4 cm. There are
prominent vessels surrounding this mass with possible active
internal bleeding (images 66-68 of series 3). There was a smaller,
less complex appearing mass in this area on the remote pelvic CT.
The ovaries are not confidently identified separate from this pelvic
process.

Other: There is a small to moderate amount free peritoneal fluid
which measures slightly higher than water density. There is no
layering high density within this fluid. Intact anterior abdominal
wall.

Musculoskeletal: No acute or significant osseous findings.
IMPRESSION: 1. Large atypical complex mass in the pelvic cul-de-sac with
suggestion of active bleeding. Some of this mass may reflect
hematoma. Given the presence a smaller less complex mass in this
area previously, the possibility of endometriosis is raised.
Ruptured ectopic pregnancy is excluded by the negative pregnancy
test. Malignancy is unlikely in this age group.
2. Intermediate density ascites, possibly hemoperitoneum.
3. Surgical evaluation recommended.
4. Cholelithiasis without evidence of cholecystitis.
5. These results were called by telephone at the time of
interpretation on 03/20/2019 at [DATE] to ADARSH TIGER , who
verbally acknowledged these results.

## 2019-08-15 IMAGING — US ARTERIAL AND VENOUS ULTRASOUND OF THE ABDOMEN PELVIS AND SCROTUM
1 series · 13 of 25 positions shown · non-contrast
Comparison: Prior CT from earlier the same day as well as previous
ultrasound from 02/14/2009.

CLINICAL DATA: Initial evaluation for pelvic mass and bleeding seen
on prior CT.

EXAM:
TRANSABDOMINAL AND TRANSVAGINAL ULTRASOUND OF PELVIS
DOPPLER ULTRASOUND OF OVARIES
TECHNIQUE: Both transabdominal and transvaginal ultrasound examinations of the
pelvis were performed. Transabdominal technique was performed for
global imaging of the pelvis including uterus, ovaries, adnexal
regions, and pelvic cul-de-sac.
It was necessary to proceed with endovaginal exam following the
transabdominal exam to visualize the uterus, endometrium, and
ovaries. Color and duplex Doppler ultrasound was utilized to
evaluate blood flow to the ovaries.

[Series 1: arterial and venous ultrasound of the abdomen pelv · 117 acquisitions, 13 frames shown]
[im 1/117]
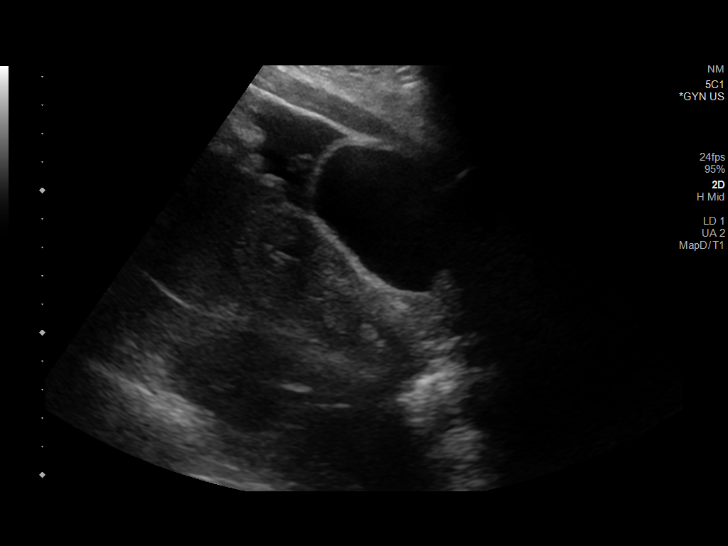
[im 10/117]
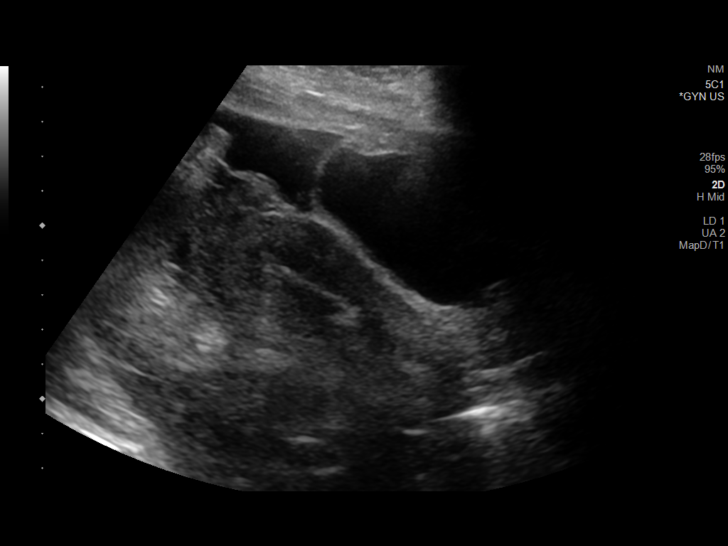
[im 20/117]
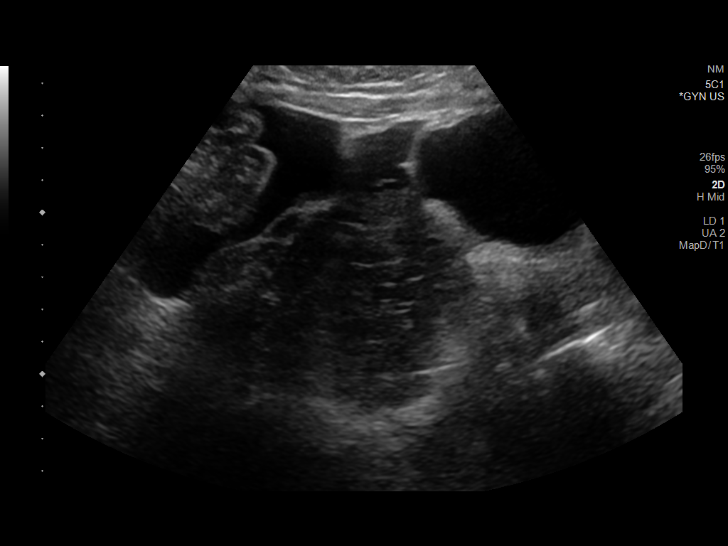
[im 30/117]
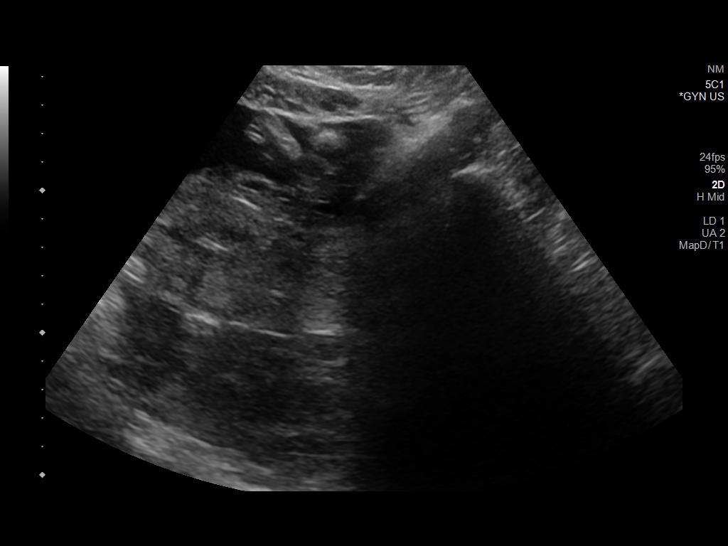
[im 39/117]
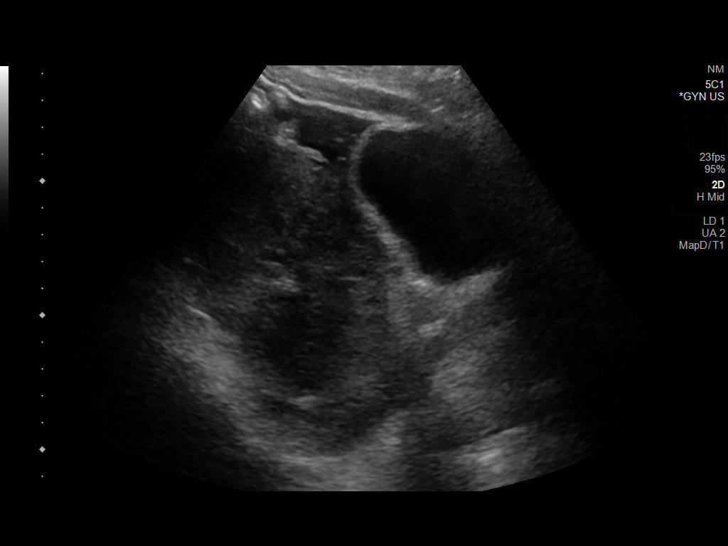
[im 49/117]
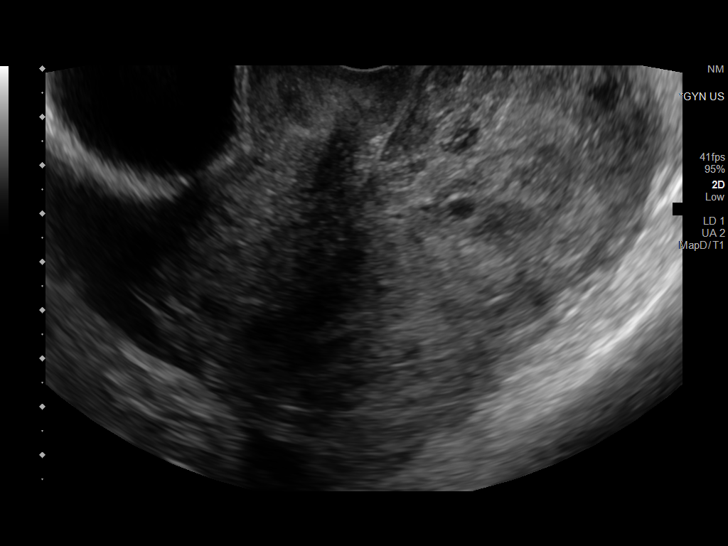
[im 59/117]
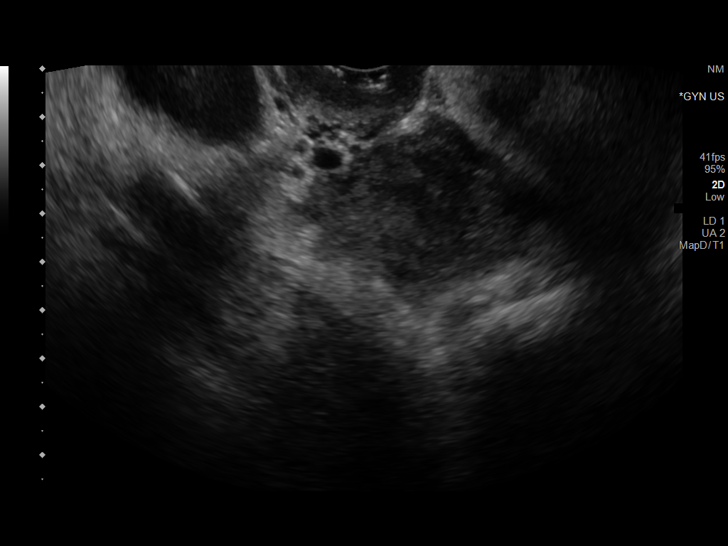
[im 68/117]
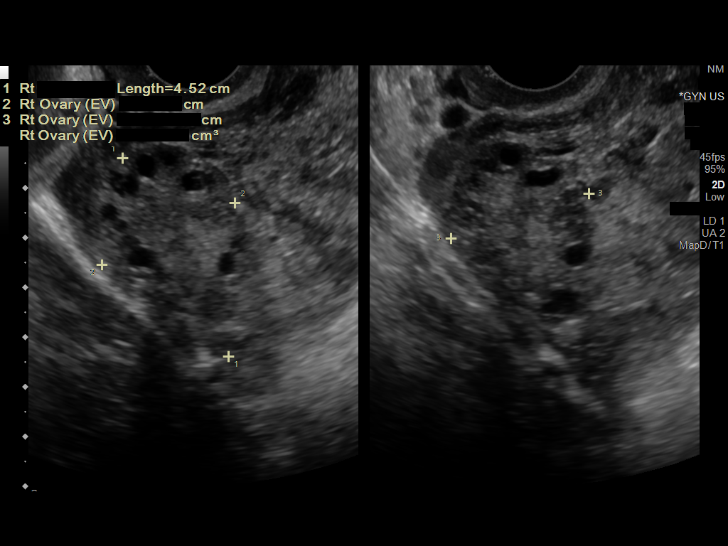
[im 78/117]
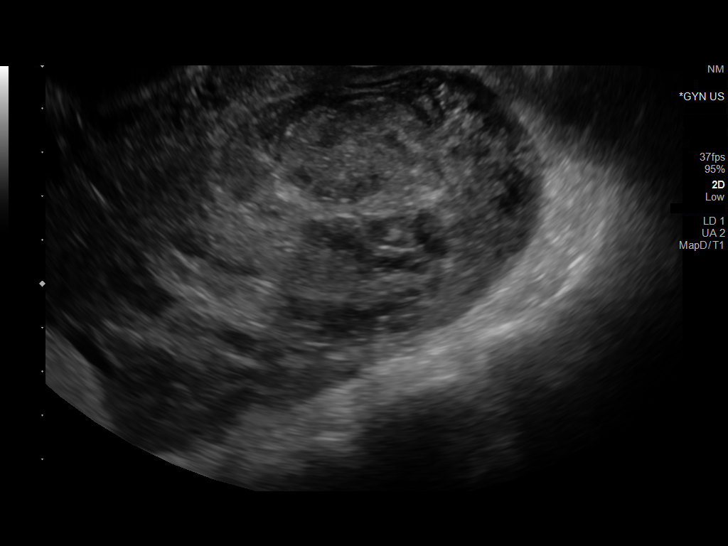
[im 88/117]
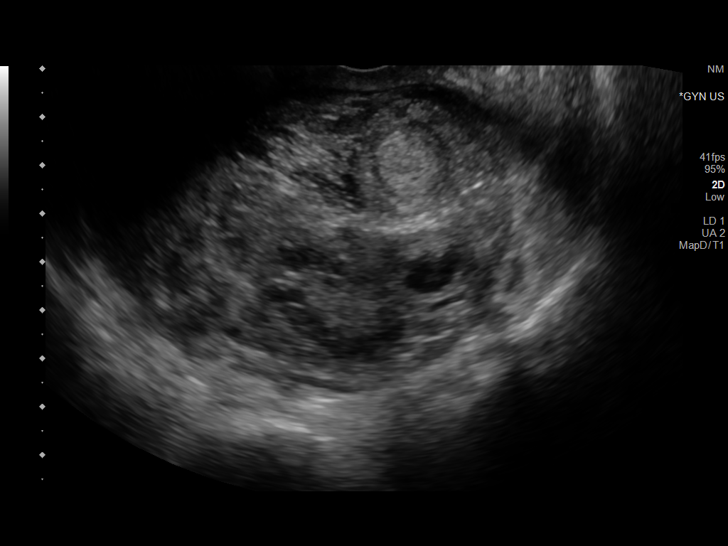
[im 97/117]
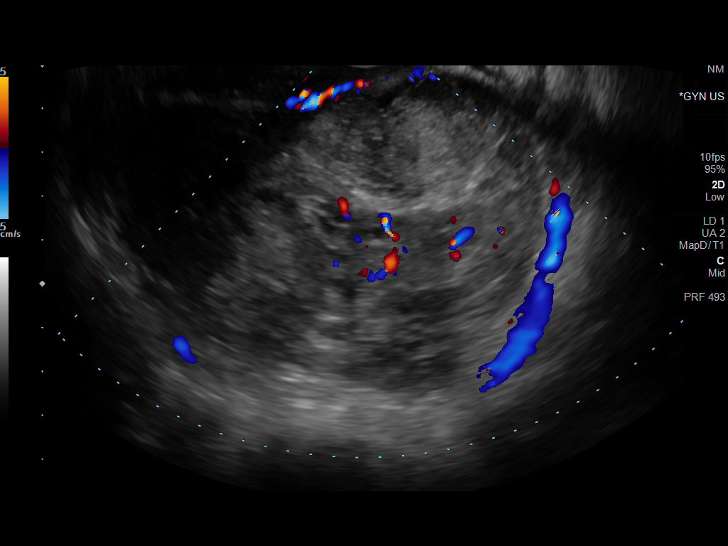
[im 107/117]
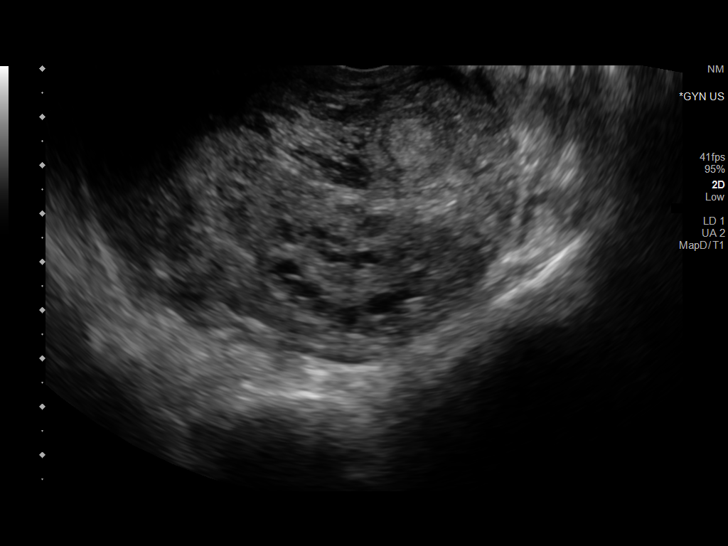
[im 117/117]
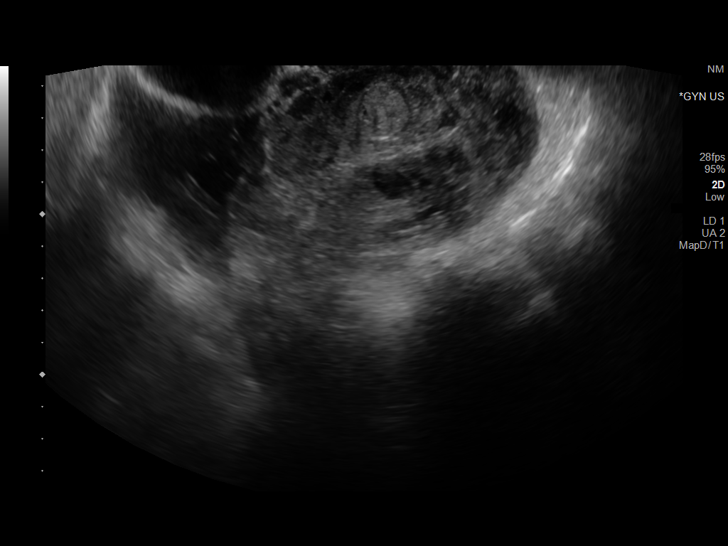

[13 of 25 positions shown; findings below may reference images not displayed]

FINDINGS: Uterus

Measurements: 8.8 x 3.4 x 4.2 cm = volume: 64.4 mL. No fibroids or
other mass visualized.

Endometrium

IUD in place within the endometrial cavity, limiting assessment. No
focal abnormality.

Right ovary

Measurements: 4.5 x 3.0 x 2.9 cm = volume: 20.4 mL. Normal
appearance/no adnexal mass.

Left ovary

Complex left adnexal mass measuring 11.6 x 6.3 x 7.5 cm,
corresponding with abnormality seen on prior CT. Lesion demonstrates
mixed echogenicity, suspected to in large part reflect blood
products given appearance on prior CT. Lesion likely arises from the
left ovary, with some ovarian tissue appearing at the margin of this
lesion. Scattered areas of internal vascularity seen, which could be
within the left ovary itself. Previously seen active hemorrhage not
visible by ultrasound.

Pulsed Doppler evaluation of the right ovary demonstrates normal
low-resistance arterial and venous waveforms. Normal arterial and
venous waveforms also seen within the left adnexal lesion, favored
to be within the left ovary.

Other findings

Moderate complex fluid seen within the pelvis, which could reflect
blood.
IMPRESSION: 1. 11.6 cm complex left adnexal/pelvic mass, corresponding with
abnormality seen on prior CT. Finding is indeterminate, and could
reflect a ruptured and/or complex cyst with associated hematoma
formation, an endometrioma which has bled, or possibly an underlying
mass/malignancy with associated hemorrhage. Ruptured ectopic
pregnancy is excluded given the negative pregnancy test. Gynecologic
referral for surgical consultation recommended. Additionally,
further assessment with dedicated pelvic MRI, with and without
contrast, could be performed for further evaluation as clinically
desired.
2. No evidence for ovarian torsion. Areas of preserved arterial and
venous Doppler waveform are seen within the left pelvic mass,
favored to lie within the underlying left ovary.
3. Associated moderate volume complex free fluid within the pelvis,
likely blood products.
4. IUD in place within the uterus.

## 2020-04-30 ENCOUNTER — Emergency Department (HOSPITAL_COMMUNITY): Payer: PRIVATE HEALTH INSURANCE

## 2020-04-30 ENCOUNTER — Encounter (HOSPITAL_COMMUNITY): Payer: Self-pay | Admitting: Emergency Medicine

## 2020-04-30 ENCOUNTER — Observation Stay (HOSPITAL_COMMUNITY): Payer: PRIVATE HEALTH INSURANCE | Admitting: Anesthesiology

## 2020-04-30 ENCOUNTER — Other Ambulatory Visit: Payer: Self-pay

## 2020-04-30 ENCOUNTER — Observation Stay (HOSPITAL_COMMUNITY)
Admission: EM | Admit: 2020-04-30 | Discharge: 2020-05-01 | Disposition: A | Discharge status: Home / Self Care | Payer: PRIVATE HEALTH INSURANCE | Attending: General Surgery | Admitting: General Surgery

## 2020-04-30 ENCOUNTER — Encounter (HOSPITAL_COMMUNITY)
Admission: EM | Disposition: A | Discharge status: Home / Self Care | Payer: Self-pay | Source: Home / Self Care | Attending: Emergency Medicine

## 2020-04-30 DIAGNOSIS — R1011 Right upper quadrant pain: Secondary | ICD-10-CM

## 2020-04-30 DIAGNOSIS — F17210 Nicotine dependence, cigarettes, uncomplicated: Secondary | ICD-10-CM | POA: Insufficient documentation

## 2020-04-30 DIAGNOSIS — K8046 Calculus of bile duct with acute and chronic cholecystitis without obstruction: Principal | ICD-10-CM | POA: Insufficient documentation

## 2020-04-30 DIAGNOSIS — K810 Acute cholecystitis: Secondary | ICD-10-CM | POA: Diagnosis present

## 2020-04-30 DIAGNOSIS — Z20822 Contact with and (suspected) exposure to covid-19: Secondary | ICD-10-CM | POA: Insufficient documentation

## 2020-04-30 DIAGNOSIS — K8020 Calculus of gallbladder without cholecystitis without obstruction: Secondary | ICD-10-CM

## 2020-04-30 DIAGNOSIS — I10 Essential (primary) hypertension: Secondary | ICD-10-CM | POA: Insufficient documentation

## 2020-04-30 HISTORY — PX: CHOLECYSTECTOMY: SHX55

## 2020-04-30 LAB — CBC
HCT: 46.2 % — ABNORMAL HIGH (ref 36.0–46.0)
Hemoglobin: 15.5 g/dL — ABNORMAL HIGH (ref 12.0–15.0)
MCH: 30.8 pg (ref 26.0–34.0)
MCHC: 33.5 g/dL (ref 30.0–36.0)
MCV: 91.8 fL (ref 80.0–100.0)
Platelets: 253 10*3/uL (ref 150–400)
RBC: 5.03 MIL/uL (ref 3.87–5.11)
RDW: 12.6 % (ref 11.5–15.5)
WBC: 14.8 10*3/uL — ABNORMAL HIGH (ref 4.0–10.5)
nRBC: 0 % (ref 0.0–0.2)

## 2020-04-30 LAB — SARS CORONAVIRUS 2 BY RT PCR (HOSPITAL ORDER, PERFORMED IN ~~LOC~~ HOSPITAL LAB): SARS Coronavirus 2: NEGATIVE

## 2020-04-30 LAB — LIPASE, BLOOD: Lipase: 27 U/L (ref 11–51)

## 2020-04-30 LAB — URINALYSIS, ROUTINE W REFLEX MICROSCOPIC
Bilirubin Urine: NEGATIVE
Glucose, UA: NEGATIVE mg/dL
Hgb urine dipstick: NEGATIVE
Ketones, ur: 5 mg/dL — AB
Leukocytes,Ua: NEGATIVE
Nitrite: NEGATIVE
Protein, ur: NEGATIVE mg/dL
Specific Gravity, Urine: 1.024 (ref 1.005–1.030)
pH: 7 (ref 5.0–8.0)

## 2020-04-30 LAB — COMPREHENSIVE METABOLIC PANEL
ALT: 53 U/L — ABNORMAL HIGH (ref 0–44)
AST: 65 U/L — ABNORMAL HIGH (ref 15–41)
Albumin: 4.2 g/dL (ref 3.5–5.0)
Alkaline Phosphatase: 54 U/L (ref 38–126)
Anion gap: 12 (ref 5–15)
BUN: 16 mg/dL (ref 6–20)
CO2: 23 mmol/L (ref 22–32)
Calcium: 9.8 mg/dL (ref 8.9–10.3)
Chloride: 103 mmol/L (ref 98–111)
Creatinine, Ser: 0.79 mg/dL (ref 0.44–1.00)
GFR calc Af Amer: >60 mL/min (ref >60)
GFR calc non Af Amer: >60 mL/min (ref >60)
Glucose, Bld: 111 mg/dL — ABNORMAL HIGH (ref 70–99)
Potassium: 4.6 mmol/L (ref 3.5–5.1)
Sodium: 138 mmol/L (ref 135–145)
Total Bilirubin: 0.7 mg/dL (ref 0.3–1.2)
Total Protein: 7.4 g/dL (ref 6.5–8.1)

## 2020-04-30 LAB — HIV ANTIBODY (ROUTINE TESTING W REFLEX): HIV Screen 4th Generation wRfx: NONREACTIVE

## 2020-04-30 LAB — SURGICAL PCR SCREEN
MRSA, PCR: NEGATIVE
Staphylococcus aureus: NEGATIVE

## 2020-04-30 LAB — I-STAT BETA HCG BLOOD, ED (MC, WL, AP ONLY): I-stat hCG, quantitative: <5 m[IU]/mL (ref <5)

## 2020-04-30 SURGERY — LAPAROSCOPIC CHOLECYSTECTOMY
Anesthesia: General | Site: Abdomen

## 2020-04-30 MED ORDER — MORPHINE SULFATE (PF) 2 MG/ML IV SOLN
2.0000 mg | INTRAVENOUS | Status: DC | PRN
  Administered 2020-04-30: 2 mg via INTRAVENOUS
  Filled 2020-04-30: qty 1

## 2020-04-30 MED ORDER — MORPHINE SULFATE (PF) 4 MG/ML IV SOLN
4.0000 mg | Freq: Once | INTRAVENOUS | Status: AC
  Administered 2020-04-30: 4 mg via INTRAVENOUS
  Filled 2020-04-30: qty 1

## 2020-04-30 MED ORDER — PROPOFOL 10 MG/ML IV BOLUS
INTRAVENOUS | Status: DC | PRN
  Administered 2020-04-30: 140 mg via INTRAVENOUS

## 2020-04-30 MED ORDER — METHOCARBAMOL 500 MG PO TABS
500.0000 mg | ORAL_TABLET | Freq: Four times a day (QID) | ORAL | Status: DC | PRN
  Administered 2020-04-30 (×2): 500 mg via ORAL
  Filled 2020-04-30 (×2): qty 1

## 2020-04-30 MED ORDER — BUPIVACAINE HCL (PF) 0.25 % IJ SOLN
INTRAMUSCULAR | Status: AC
  Filled 2020-04-30: qty 30

## 2020-04-30 MED ORDER — KETOROLAC TROMETHAMINE 15 MG/ML IJ SOLN
15.0000 mg | Freq: Four times a day (QID) | INTRAMUSCULAR | Status: DC | PRN
  Administered 2020-04-30: 15 mg via INTRAVENOUS
  Filled 2020-04-30: qty 1

## 2020-04-30 MED ORDER — SCOPOLAMINE 1 MG/3DAYS TD PT72
1.0000 | MEDICATED_PATCH | TRANSDERMAL | Status: DC
  Administered 2020-04-30: 1.5 mg via TRANSDERMAL
  Filled 2020-04-30: qty 1

## 2020-04-30 MED ORDER — ONDANSETRON HCL 4 MG/2ML IJ SOLN
INTRAMUSCULAR | Status: AC
  Filled 2020-04-30: qty 2

## 2020-04-30 MED ORDER — 0.9 % SODIUM CHLORIDE (POUR BTL) OPTIME
TOPICAL | Status: DC | PRN
  Administered 2020-04-30: 1000 mL

## 2020-04-30 MED ORDER — LIDOCAINE 2% (20 MG/ML) 5 ML SYRINGE
INTRAMUSCULAR | Status: DC | PRN
  Administered 2020-04-30: 60 mg via INTRAVENOUS

## 2020-04-30 MED ORDER — SODIUM CHLORIDE 0.9% FLUSH: 3.0000 mL | Freq: Once | INTRAVENOUS | Status: DC

## 2020-04-30 MED ORDER — ROCURONIUM BROMIDE 10 MG/ML (PF) SYRINGE
PREFILLED_SYRINGE | INTRAVENOUS | Status: DC | PRN
  Administered 2020-04-30: 40 mg via INTRAVENOUS

## 2020-04-30 MED ORDER — NICOTINE 21 MG/24HR TD PT24
21.0000 mg | MEDICATED_PATCH | Freq: Every day | TRANSDERMAL | Status: DC
  Administered 2020-04-30 – 2020-05-01 (×2): 21 mg via TRANSDERMAL
  Filled 2020-04-30 (×2): qty 1

## 2020-04-30 MED ORDER — MIDAZOLAM HCL 5 MG/5ML IJ SOLN
INTRAMUSCULAR | Status: DC | PRN
  Administered 2020-04-30: 2 mg via INTRAVENOUS

## 2020-04-30 MED ORDER — PROPOFOL 10 MG/ML IV BOLUS
INTRAVENOUS | Status: AC
  Filled 2020-04-30: qty 20

## 2020-04-30 MED ORDER — OXYCODONE HCL 5 MG PO TABS: 5.0000 mg | ORAL_TABLET | ORAL | Status: DC | PRN

## 2020-04-30 MED ORDER — HYDROMORPHONE HCL 1 MG/ML IJ SOLN
0.2500 mg | INTRAMUSCULAR | Status: DC | PRN
  Administered 2020-04-30 (×2): 0.5 mg via INTRAVENOUS

## 2020-04-30 MED ORDER — CELECOXIB 200 MG PO CAPS
400.0000 mg | ORAL_CAPSULE | ORAL | Status: AC
  Administered 2020-04-30: 400 mg via ORAL
  Filled 2020-04-30: qty 2

## 2020-04-30 MED ORDER — ACETAMINOPHEN 650 MG RE SUPP: 650.0000 mg | Freq: Four times a day (QID) | RECTAL | Status: DC | PRN

## 2020-04-30 MED ORDER — LIDOCAINE 2% (20 MG/ML) 5 ML SYRINGE
INTRAMUSCULAR | Status: AC
  Filled 2020-04-30: qty 5

## 2020-04-30 MED ORDER — SODIUM CHLORIDE 0.9 % IV BOLUS
1000.0000 mL | Freq: Once | INTRAVENOUS | Status: AC
  Administered 2020-04-30: 1000 mL via INTRAVENOUS

## 2020-04-30 MED ORDER — SODIUM CHLORIDE 0.9 % IV SOLN
2.0000 g | INTRAVENOUS | Status: DC
  Administered 2020-04-30: 2 g via INTRAVENOUS
  Filled 2020-04-30 (×2): qty 20

## 2020-04-30 MED ORDER — ONDANSETRON HCL 4 MG/2ML IJ SOLN
4.0000 mg | Freq: Once | INTRAMUSCULAR | Status: AC
  Administered 2020-04-30: 4 mg via INTRAVENOUS
  Filled 2020-04-30: qty 2

## 2020-04-30 MED ORDER — SUCCINYLCHOLINE CHLORIDE 20 MG/ML IJ SOLN
INTRAMUSCULAR | Status: DC | PRN
  Administered 2020-04-30: 100 mg via INTRAVENOUS

## 2020-04-30 MED ORDER — BUPIVACAINE HCL 0.25 % IJ SOLN
INTRAMUSCULAR | Status: DC | PRN
  Administered 2020-04-30: 10 mL

## 2020-04-30 MED ORDER — CELECOXIB 200 MG PO CAPS
ORAL_CAPSULE | ORAL | Status: AC
  Filled 2020-04-30: qty 1

## 2020-04-30 MED ORDER — SODIUM CHLORIDE 0.9 % IR SOLN
Status: DC | PRN
  Administered 2020-04-30: 1000 mL

## 2020-04-30 MED ORDER — DEXAMETHASONE SODIUM PHOSPHATE 10 MG/ML IJ SOLN
INTRAMUSCULAR | Status: DC | PRN
Start: 2020-04-30 — End: 2020-04-30
  Administered 2020-04-30: 10 mg via INTRAVENOUS

## 2020-04-30 MED ORDER — LACTATED RINGERS IV SOLN: INTRAVENOUS | Status: DC | PRN

## 2020-04-30 MED ORDER — DIPHENHYDRAMINE HCL 50 MG/ML IJ SOLN: 25.0000 mg | Freq: Four times a day (QID) | INTRAMUSCULAR | Status: DC | PRN

## 2020-04-30 MED ORDER — HYDROCODONE-ACETAMINOPHEN 5-325 MG PO TABS
1.0000 | ORAL_TABLET | ORAL | Status: DC | PRN
  Administered 2020-04-30 – 2020-05-01 (×3): 2 via ORAL
  Filled 2020-04-30 (×3): qty 2

## 2020-04-30 MED ORDER — HYDROMORPHONE HCL 1 MG/ML IJ SOLN
INTRAMUSCULAR | Status: AC
  Filled 2020-04-30: qty 1

## 2020-04-30 MED ORDER — SUGAMMADEX SODIUM 200 MG/2ML IV SOLN
INTRAVENOUS | Status: DC | PRN
  Administered 2020-04-30: 200 mg via INTRAVENOUS

## 2020-04-30 MED ORDER — MIDAZOLAM HCL 2 MG/2ML IJ SOLN
INTRAMUSCULAR | Status: AC
  Filled 2020-04-30: qty 2

## 2020-04-30 MED ORDER — ROCURONIUM BROMIDE 10 MG/ML (PF) SYRINGE
PREFILLED_SYRINGE | INTRAVENOUS | Status: AC
  Filled 2020-04-30: qty 10

## 2020-04-30 MED ORDER — ENOXAPARIN SODIUM 40 MG/0.4ML ~~LOC~~ SOLN
40.0000 mg | SUBCUTANEOUS | Status: DC
  Administered 2020-05-01: 40 mg via SUBCUTANEOUS
  Filled 2020-04-30: qty 0.4

## 2020-04-30 MED ORDER — SUCCINYLCHOLINE CHLORIDE 200 MG/10ML IV SOSY
PREFILLED_SYRINGE | INTRAVENOUS | Status: AC
  Filled 2020-04-30: qty 20

## 2020-04-30 MED ORDER — ONDANSETRON 4 MG PO TBDP: 4.0000 mg | ORAL_TABLET | Freq: Four times a day (QID) | ORAL | Status: DC | PRN

## 2020-04-30 MED ORDER — MORPHINE SULFATE (PF) 2 MG/ML IV SOLN
2.0000 mg | INTRAVENOUS | Status: DC | PRN
  Administered 2020-04-30 – 2020-05-01 (×4): 2 mg via INTRAVENOUS
  Filled 2020-04-30 (×5): qty 1

## 2020-04-30 MED ORDER — ONDANSETRON HCL 4 MG/2ML IJ SOLN
INTRAMUSCULAR | Status: DC | PRN
  Administered 2020-04-30: 4 mg via INTRAVENOUS

## 2020-04-30 MED ORDER — POLYETHYLENE GLYCOL 3350 17 G PO PACK: 17.0000 g | PACK | Freq: Every day | ORAL | Status: DC | PRN

## 2020-04-30 MED ORDER — FENTANYL CITRATE (PF) 100 MCG/2ML IJ SOLN
INTRAMUSCULAR | Status: DC | PRN
  Administered 2020-04-30 (×5): 50 ug via INTRAVENOUS

## 2020-04-30 MED ORDER — FENTANYL CITRATE (PF) 250 MCG/5ML IJ SOLN
INTRAMUSCULAR | Status: AC
  Filled 2020-04-30: qty 5

## 2020-04-30 MED ORDER — ONDANSETRON HCL 4 MG/2ML IJ SOLN: 4.0000 mg | Freq: Four times a day (QID) | INTRAMUSCULAR | Status: DC | PRN

## 2020-04-30 MED ORDER — DIPHENHYDRAMINE HCL 25 MG PO CAPS: 25.0000 mg | ORAL_CAPSULE | Freq: Four times a day (QID) | ORAL | Status: DC | PRN

## 2020-04-30 MED ORDER — GABAPENTIN 300 MG PO CAPS
300.0000 mg | ORAL_CAPSULE | ORAL | Status: AC
  Administered 2020-04-30: 300 mg via ORAL
  Filled 2020-04-30: qty 1

## 2020-04-30 MED ORDER — SODIUM CHLORIDE 0.9 % IV SOLN: INTRAVENOUS | Status: DC

## 2020-04-30 MED ORDER — PANTOPRAZOLE SODIUM 40 MG IV SOLR
40.0000 mg | Freq: Every day | INTRAVENOUS | Status: DC
  Administered 2020-04-30: 40 mg via INTRAVENOUS
  Filled 2020-04-30: qty 40

## 2020-04-30 MED ORDER — SIMETHICONE 80 MG PO CHEW: 40.0000 mg | CHEWABLE_TABLET | Freq: Four times a day (QID) | ORAL | Status: DC | PRN

## 2020-04-30 MED ORDER — CHLORHEXIDINE GLUCONATE 0.12 % MT SOLN
OROMUCOSAL | Status: AC
  Administered 2020-04-30: 15 mL
  Filled 2020-04-30: qty 15

## 2020-04-30 MED ORDER — ACETAMINOPHEN 325 MG PO TABS: 650.0000 mg | ORAL_TABLET | Freq: Four times a day (QID) | ORAL | Status: DC | PRN

## 2020-04-30 MED ORDER — DEXAMETHASONE SODIUM PHOSPHATE 10 MG/ML IJ SOLN
INTRAMUSCULAR | Status: AC
  Filled 2020-04-30: qty 1

## 2020-04-30 MED ORDER — METOPROLOL TARTRATE 5 MG/5ML IV SOLN: 5.0000 mg | Freq: Four times a day (QID) | INTRAVENOUS | Status: DC | PRN

## 2020-04-30 MED ORDER — ACETAMINOPHEN 500 MG PO TABS
1000.0000 mg | ORAL_TABLET | ORAL | Status: AC
  Administered 2020-04-30: 1000 mg via ORAL
  Filled 2020-04-30: qty 2

## 2020-04-30 SURGICAL SUPPLY — 38 items
CANISTER SUCT 3000ML PPV (MISCELLANEOUS) ×3 IMPLANT
CHLORAPREP W/TINT 26 (MISCELLANEOUS) ×3 IMPLANT
CLIP VESOLOCK MED LG 6/CT (CLIP) ×3 IMPLANT
COVER SURGICAL LIGHT HANDLE (MISCELLANEOUS) ×3 IMPLANT
COVER TRANSDUCER ULTRASND (DRAPES) ×3 IMPLANT
COVER WAND RF STERILE (DRAPES) ×3 IMPLANT
DEFOGGER SCOPE WARMER CLEARIFY (MISCELLANEOUS) IMPLANT
DERMABOND ADVANCED (GAUZE/BANDAGES/DRESSINGS) ×2
DERMABOND ADVANCED .7 DNX12 (GAUZE/BANDAGES/DRESSINGS) ×1 IMPLANT
ELECT REM PT RETURN 9FT ADLT (ELECTROSURGICAL) ×3
ELECTRODE REM PT RTRN 9FT ADLT (ELECTROSURGICAL) ×1 IMPLANT
GLOVE BIO SURGEON STRL SZ7.5 (GLOVE) ×3 IMPLANT
GOWN STRL REUS W/ TWL LRG LVL3 (GOWN DISPOSABLE) ×2 IMPLANT
GOWN STRL REUS W/ TWL XL LVL3 (GOWN DISPOSABLE) ×1 IMPLANT
GOWN STRL REUS W/TWL LRG LVL3 (GOWN DISPOSABLE) ×4
GOWN STRL REUS W/TWL XL LVL3 (GOWN DISPOSABLE) ×2
GRASPER SUT TROCAR 14GX15 (MISCELLANEOUS) ×3 IMPLANT
KIT BASIN OR (CUSTOM PROCEDURE TRAY) ×3 IMPLANT
KIT TURNOVER KIT B (KITS) ×3 IMPLANT
NDL INSUFFLATION 14GA 120MM (NEEDLE) ×1 IMPLANT
NEEDLE INSUFFLATION 14GA 120MM (NEEDLE) ×3 IMPLANT
NS IRRIG 1000ML POUR BTL (IV SOLUTION) ×3 IMPLANT
PAD ARMBOARD 7.5X6 YLW CONV (MISCELLANEOUS) ×3 IMPLANT
POUCH LAPAROSCOPIC INSTRUMENT (MISCELLANEOUS) ×3 IMPLANT
POUCH RETRIEVAL ECOSAC 10 (ENDOMECHANICALS) IMPLANT
POUCH RETRIEVAL ECOSAC 10MM (ENDOMECHANICALS) ×2
SCISSORS LAP 5X35 DISP (ENDOMECHANICALS) ×3 IMPLANT
SET IRRIG TUBING LAPAROSCOPIC (IRRIGATION / IRRIGATOR) ×3 IMPLANT
SET TUBE SMOKE EVAC HIGH FLOW (TUBING) ×3 IMPLANT
SLEEVE ENDOPATH XCEL 5M (ENDOMECHANICALS) ×5 IMPLANT
SPECIMEN JAR SMALL (MISCELLANEOUS) ×3 IMPLANT
SUT MNCRL AB 4-0 PS2 18 (SUTURE) ×3 IMPLANT
TOWEL GREEN STERILE (TOWEL DISPOSABLE) ×3 IMPLANT
TOWEL GREEN STERILE FF (TOWEL DISPOSABLE) ×3 IMPLANT
TRAY LAPAROSCOPIC MC (CUSTOM PROCEDURE TRAY) ×3 IMPLANT
TROCAR XCEL NON-BLD 11X100MML (ENDOMECHANICALS) ×3 IMPLANT
TROCAR XCEL NON-BLD 5MMX100MML (ENDOMECHANICALS) ×3 IMPLANT
WATER STERILE IRR 1000ML POUR (IV SOLUTION) ×3 IMPLANT

## 2020-04-30 NOTE — ED Provider Notes (Signed)
[facility] EMERGENCY DEPARTMENT Provider Note   CSN: [id number] Arrival date & time: 04/30/20  0254     History Chief Complaint  Patient presents with  . Abdominal Pain  . Back Pain    Cheryl Munoz is a 34 y.o. female with a hx of left ovarian cyst presents to the Emergency Department complaining of intermittent upper abdominal and back pain.  Patient reports this began approximately 1 year ago.  She states that initially it happened once per month but has increased in frequency to 2 times per week.  She reports the last several episodes have been severe in nature and have been associated with vomiting.  Patient reports that the pain starts in her mid back and radiates around her flanks to her upper abdomen.  She denies aspirin or NSAID usage, history of peptic ulcer disease.  She reports drinking alcohol only occasionally with last ingestion being 48 hours ago.  Patient reports that sometimes food worsens pain and tonight pain got significantly worse after eating.  Patient reports the pain is so intense that it also makes her sweat.  Nothing seems to make it better.  Pain usually last several hours and then improves spontaneously however pain began around 9 PM tonight and has not resolved.  Patient denies fever, chills, headache, neck pain, chest pain, shortness of breath, weakness, dizziness, syncope, dysuria, hematuria.    The history is provided by the patient and medical records. No language interpreter was used.       Past Medical History:  Diagnosis Date  . Ovarian cyst   . Preeclampsia     Patient Active Problem List   Diagnosis Date Noted  . Postoperative state 03/21/2019  . Ovarian mass, left 03/20/2019    Past Surgical History:  Procedure Laterality Date  . LAPAROSCOPIC SALPINGO OOPHERECTOMY Left 03/20/2019   Procedure: LAPAROSCOPIC SALPINGO OOPHORECTOMY;  Surgeon: Lazaro Arms, MD;  Location: [facility] OR;  Service: Gynecology;  Laterality: Left;     OB  History    Gravida  2   Para  2   Term  1   Preterm  1   AB      Living  2     SAB      TAB      Ectopic      Multiple      Live Births  2           Family History  Problem Relation Age of Onset  . Other Father        MVA    Social History   Tobacco Use  . Smoking status: Current Every Day Smoker    Packs/day: 0.50    Types: Cigarettes  . Smokeless tobacco: Never Used  Substance Use Topics  . Alcohol use: Yes    Comment: occassional  . Drug use: Not Currently    Home Medications Prior to Admission medications   Medication Sig Start Date End Date Taking? Authorizing Provider  acetaminophen (TYLENOL) 500 MG tablet Take 1,000 mg by mouth every 6 (six) hours as needed for moderate pain or headache.   Yes [provider]  cetirizine (ZYRTEC) 10 MG tablet Take 10 mg by mouth as needed for allergies.   Yes [provider]  ibuprofen (ADVIL) 200 MG tablet Take 400 mg by mouth every 6 (six) hours as needed for headache or mild pain.   Yes [provider]  naproxen sodium (ALEVE) 220 MG tablet Take 440 mg by  mouth as needed (pain).   Yes [provider]  Olopatadine HCl 0.2 % SOLN Apply 1 drop to eye daily as needed (Allergies).   Yes [provider]  HYDROcodone-acetaminophen (NORCO/VICODIN) 5-325 MG tablet Take 1 tablet by mouth every 6 (six) hours as needed. Patient not taking: Reported on 04/30/2020 03/21/19   Lazaro Arms, MD    Allergies    Patient has no known allergies.  Review of Systems   Review of Systems  Constitutional: Negative for appetite change, diaphoresis, fatigue, fever and unexpected weight change.  HENT: Negative for mouth sores.   Eyes: Negative for visual disturbance.  Respiratory: Negative for cough, chest tightness, shortness of breath and wheezing.   Cardiovascular: Negative for chest pain.  Gastrointestinal: Positive for abdominal pain, nausea and vomiting. Negative for constipation and  diarrhea.  Endocrine: Negative for polydipsia, polyphagia and polyuria.  Genitourinary: Negative for dysuria, frequency, hematuria and urgency.  Musculoskeletal: Positive for back pain. Negative for neck stiffness.  Skin: Negative for rash.  Allergic/Immunologic: Negative for immunocompromised state.  Neurological: Negative for syncope, light-headedness and headaches.  Hematological: Does not bruise/bleed easily.  Psychiatric/Behavioral: Negative for sleep disturbance. The patient is not nervous/anxious.     Physical Exam Updated Vital Signs BP 109/64   Pulse (!) 53   Temp 98 F (36.7 C) (Oral)   Resp 19   Ht 5\' 3"  (1.6 m)   Wt 70.3 kg   SpO2 95%   BMI 27.46 kg/m   Physical Exam Vitals and nursing note reviewed.  Constitutional:      General: She is not in acute distress.    Appearance: She is not diaphoretic.  HENT:     Head: Normocephalic.  Eyes:     General: No scleral icterus.    Conjunctiva/sclera: Conjunctivae normal.  Cardiovascular:     Rate and Rhythm: Normal rate and regular rhythm.     Pulses: Normal pulses.          Radial pulses are 2+ on the right side and 2+ on the left side.  Pulmonary:     Effort: No tachypnea, accessory muscle usage, prolonged expiration, respiratory distress or retractions.     Breath sounds: Normal breath sounds. No stridor.     Comments: Equal chest rise. No increased work of breathing. Abdominal:     General: Bowel sounds are normal. There is no distension.     Palpations: Abdomen is soft.     Tenderness: There is abdominal tenderness in the right upper quadrant and epigastric area. There is guarding. There is no right CVA tenderness, left CVA tenderness or rebound. Positive signs include Murphy's sign.  Musculoskeletal:     Cervical back: Normal range of motion.     Comments: Moves all extremities equally and without difficulty.  Skin:    General: Skin is warm and dry.     Capillary Refill: Capillary refill takes less than 2  seconds.  Neurological:     Mental Status: She is alert.     GCS: GCS eye subscore is 4. GCS verbal subscore is 5. GCS motor subscore is 6.     Comments: Speech is clear and goal oriented.  Psychiatric:        Mood and Affect: Mood normal.     ED Results / Procedures / Treatments   Labs (all labs ordered are listed, but only abnormal results are displayed) Labs Reviewed  COMPREHENSIVE METABOLIC PANEL - Abnormal; Notable for the following components:  Result Value   Glucose, Bld 111 (*)    AST 65 (*)    ALT 53 (*)    All other components within normal limits  CBC - Abnormal; Notable for the following components:   WBC 14.8 (*)    Hemoglobin 15.5 (*)    HCT 46.2 (*)    All other components within normal limits  LIPASE, BLOOD  URINALYSIS, ROUTINE W REFLEX MICROSCOPIC  I-STAT BETA HCG BLOOD, ED ([facility], [facility], [facility] ONLY)    Radiology US Abdomen Limited  Result Date: 04/30/2020 CLINICAL DATA:  Right upper quadrant pain for months. EXAM: ULTRASOUND ABDOMEN LIMITED RIGHT UPPER QUADRANT COMPARISON:  03/20/2019 abdominal CT FINDINGS: Gallbladder: Multiple gallstones with shadowing, obscuring some of the gallbladder wall. No gallbladder wall thickening or focal tenderness per sonographer exam, although history of analgesia). Common bile duct: Diameter: 3 mm.  Where visualized, no filling defect. Liver: No focal lesion identified. Within normal limits in parenchymal echogenicity. Portal vein is patent on color Doppler imaging with normal direction of blood flow towards the liver. IMPRESSION: Cholelithiasis without evidence of acute cholecystitis. Electronically Signed   By: Marnee Spring M.D.   On: 04/30/2020 05:26    Procedures Procedures (including critical care time)  Medications Ordered in ED Medications  sodium chloride flush (NS) 0.9 % injection 3 mL (has no administration in time range)  morphine 4 MG/ML injection 4 mg (4 mg Intravenous Given 04/30/20 0423)  ondansetron (ZOFRAN)  injection 4 mg (4 mg Intravenous Given 04/30/20 0423)  sodium chloride 0.9 % bolus 1,000 mL (0 mLs Intravenous Stopped 04/30/20 0622)  morphine 4 MG/ML injection 4 mg (4 mg Intravenous Given 04/30/20 0109)    ED Course  I have reviewed the triage vital signs and the nursing notes.  Pertinent labs & imaging results that were available during my care of the patient were reviewed by me and considered in my medical decision making (see chart for details).  Clinical Course as of Apr 30 648  Mon Apr 30, 2020  0407 Elevation in AST/ALT noted  AST(!): 65 [HM]  0407 leukocytosis  WBC(!): 14.8 [HM]  0605 US Abdomen Limited [HM]  0605 Cholelithiasis without sonographic evidence of cholecystitis.  Patient continues to have pain.  Will redose pain medication.   [HM]    Clinical Course User Index [HM] Jazzy Parmer, Boyd Kerbs   MDM Rules/Calculators/A&P                      Presents with epigastric abdominal pain nausea and vomiting.  Positive Murphy sign on my exam.  Concern for possible cholecystitis versus choledocholithiasis versus cholelithiasis.  Patient does have elevated white blood cell count and mild elevations in AST and ALT.  She does not have a history of alcoholism.  Pain medication and Zofran given ultrasound ordered.  6:33 AM Ultrasound shows cholelithiasis without evidence of cholecystitis.  Patient continues to have significant pain.  She is tearful.  All previous episodes of pain have resolved spontaneously however she is not obtaining much relief from pain despite intervention today.  Redosing pain medications.  Will discuss with surgery.  Discussed findings and plan with patient.  She is in agreement.  6:48 AM Discussed with Dr. Janee Morn, general surgery who will evaluate.    Final Clinical Impression(s) / ED Diagnoses Final diagnoses:  RUQ abdominal pain  Gallstones    Rx / DC Orders ED Discharge Orders    None       Andrika Peraza, Boyd Kerbs 04/30/20  [id number]      Ripley Fraise, MD 04/30/20 2308

## 2020-04-30 NOTE — Progress Notes (Signed)
Pt c/o gas type of pain. Ambulated 3/4 lap on unit

## 2020-04-30 NOTE — Anesthesia Preprocedure Evaluation (Addendum)
Anesthesia Evaluation  Patient identified by MRN, date of birth, ID band Patient awake    Reviewed: Allergy & Precautions, H&P , NPO status , Patient's Chart, lab work & pertinent test results  Airway Mallampati: II  TM Distance: >3 FB Neck ROM: Full    Dental no notable dental hx. (+) Teeth Intact, Dental Advisory Given   Pulmonary Current Smoker and Patient abstained from smoking.,    Pulmonary exam normal breath sounds clear to auscultation       Cardiovascular hypertension, negative cardio ROS   Rhythm:Regular Rate:Normal     Neuro/Psych negative neurological ROS  negative psych ROS   GI/Hepatic negative GI ROS, Neg liver ROS,   Endo/Other  negative endocrine ROS  Renal/GU negative Renal ROS  negative genitourinary   Musculoskeletal   Abdominal   Peds  Hematology negative hematology ROS (+)   Anesthesia Other Findings   Reproductive/Obstetrics negative OB ROS                            Anesthesia Physical Anesthesia Plan  ASA: II  Anesthesia Plan: General   Post-op Pain Management:    Induction: Intravenous  PONV Risk Score and Plan: 3 and Ondansetron, Dexamethasone and Midazolam  Airway Management Planned: Oral ETT  Additional Equipment:   Intra-op Plan:   Post-operative Plan: Extubation in OR  Informed Consent: I have reviewed the patients History and Physical, chart, labs and discussed the procedure including the risks, benefits and alternatives for the proposed anesthesia with the patient or authorized representative who has indicated his/her understanding and acceptance.     Dental advisory given  Plan Discussed with: CRNA  Anesthesia Plan Comments:         Anesthesia Quick Evaluation

## 2020-04-30 NOTE — Op Note (Signed)
04/30/2020  2:52 PM  PATIENT:  Cheryl Munoz  34 y.o. female  PRE-OPERATIVE DIAGNOSIS:  ACUTE CHOLECYSTITIS  POST-OPERATIVE DIAGNOSIS:  ACUTE CHOLECYSTITIS, CHOLELITHIASIS  PROCEDURE:  Procedure(s): LAPAROSCOPIC CHOLECYSTECTOMY (N/A)  SURGEON:  Surgeon(s) and Role:    Axel Filler, MD - Primary  ANESTHESIA:   local and general  EBL:  minimal   BLOOD ADMINISTERED:none  DRAINS: none   LOCAL MEDICATIONS USED:  BUPIVICAINE   SPECIMEN:  Source of Specimen:  GALLBLADDER  DISPOSITION OF SPECIMEN:  PATHOLOGY  COUNTS:  YES  TOURNIQUET:  * No tourniquets in log *  DICTATION: .Dragon Dictation The patient was taken to the operating and placed in the supine position with bilateral SCDs in place.  The patient was prepped and draped in the usual sterile fashion. A time out was called and all facts were verified. A pneumoperitoneum was obtained via A Veress needle technique to a pressure of 59mm of mercury.  A 67mm trochar was then placed in the right upper quadrant under visualization, and there were no injuries to any abdominal organs. A 11 mm port was then placed in the umbilical region after infiltrating with local anesthesia under direct visualization. A second and third epigastric port and right lower quadrant port placement under direct visualization, respectively.    The gallbladder was identified and retracted, the peritoneum was then sharply dissected from the gallbladder and this dissection was carried down to Calot's triangle. The gallbladder was identified and stripped away circumferentially and seen going into the gallbladder 360, the critical angle was obtained.  2 clips were placed proximally one distally and the cystic duct transected. The cystic artery was identified and 2 clips placed proximally and one distally and transected.  We then proceeded to remove the gallbladder off the hepatic fossa with Bovie cautery. A retrieval bag was then placed in the abdomen and  gallbladder placed in the bag. The hepatic fossa was then reexamined and hemostasis was achieved with Bovie cautery and was excellent at the end of the case.   The subhepatic fossa and perihepatic fossa was then irrigated until the effluent was clear.  The gallbladder and bag were removed from the abdominal cavity. The 11 mm trocar fascia was reapproximated with the Endo Close #1 Vicryl X3.  The pneumoperitoneum was evacuated and all trochars removed under direct visulalization.  The skin was then closed with 4-0 Monocryl and the skin dressed with Dermabond.    The patient was awaken from general anesthesia and taken to the recovery room in stable condition.   PLAN OF CARE: Admit for overnight observation  PATIENT DISPOSITION:  PACU - hemodynamically stable.   Delay start of Pharmacological VTE agent (>24hrs) due to surgical blood loss or risk of bleeding: not applicable

## 2020-04-30 NOTE — H&P (Addendum)
Cheryl Munoz 1986/05/31  [id number].    Requesting MD: Abigail Butts, PA-C (attending - Dr. Christy Gentles) Chief Complaint/Reason for Consult: Acute Cholecystitis   HPI: Cheryl Munoz is a 34 y.o. female with no significant past medical history who presented to [facility] for upper abdominal pain.  Patient reports over the last 1 year she has been having intermittent episodes of upper abdominal pain with radiation to her back/flank that usually occurs after eating and in the middle of the night.  Her episodes usually last for approximately 1-2 hours and occur 1x/month.  She notes over the last 1 week she has had 4 episodes of increasingly longer and more painful episodes.  Her episodes are now associated with nausea and emesis.  She notes yesterday around 8-9 PM, after eating dinner, she began having similar pain in her upper abdomen, with radiation to her back and flanks with associated nausea and emesis. No fever, chills, constipation, diarrhea, or urinary symptoms.  She tried Motrin for this at home without any relief.  She presented to the ED for evaluation.  In the ED patient was afebrile without tachycardia or hypotension.  WBC 14.8.  AST 65, ALT 53, alk phos 54, T bili 0.7, lipase within normal limits.  Ultrasound with cholelithiasis.  Patient continued to have pain despite IV pain medication.  We are asked to see.  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 Last use 48 hours ago No illicit drug use  ROS: Review of Systems  Constitutional: Negative for chills and fever.  Respiratory: Negative for cough and shortness of breath.   Cardiovascular: Negative for chest pain.  Gastrointestinal: Positive for abdominal pain, nausea and vomiting. Negative for constipation and diarrhea.  Genitourinary: Negative for dysuria.  Psychiatric/Behavioral: Negative for substance abuse.  All  other systems reviewed and are negative.   Family History  Problem Relation Age of Onset  . Other Father        MVA    Past Medical History:  Diagnosis Date  . Ovarian cyst   . Preeclampsia     Past Surgical History:  Procedure Laterality Date  . LAPAROSCOPIC SALPINGO OOPHERECTOMY Left 03/20/2019   Procedure: LAPAROSCOPIC SALPINGO OOPHORECTOMY;  Surgeon: Florian Buff, MD;  Location: [place];  Service: Gynecology;  Laterality: Left;    Social History:  reports that she has been smoking cigarettes. She has been smoking about 0.50 packs per day. She has never used smokeless tobacco. She reports current alcohol use. She reports previous drug use.  Allergies: No Known Allergies  (Not in a hospital admission)    Physical Exam: Blood pressure (!) 142/87, pulse 62, temperature 98 F (36.7 C), temperature source Oral, resp. rate 18, height '5\' 3"'  (1.6 m), weight 70.3 kg, SpO2 96 %. General: pleasant, WD/WN white female who is laying in bed in NAD HEENT: head is normocephalic, atraumatic.  Sclera are noninjected.  PERRL.  Ears and nose without any masses or lesions.  Mouth is pink and moist. Dentition fair Heart: regular, rate, and rhythm.  Normal s1,s2. No obvious murmurs, gallops, or rubs noted.  Palpable pedal pulses bilaterally  Lungs: CTAB, no wheezes, rhonchi, or rales noted.  Respiratory effort nonlabored Abd: Soft, ND, tenderness of the upper abdomen, RUQ>epigastric. Negative Murphy's sign. No peritonitis. +BS, no masses, hernias, or organomegaly MS: no BUE/BLE edema, calves soft and nontender Skin: warm and dry with no masses, lesions, or rashes Psych:  A&Ox4 with an appropriate affect Neuro: cranial nerves grossly intact, equal strength in BUE/BLE bilaterally, normal speech, though process intact  Results for orders placed or performed during the hospital encounter of 04/30/20 (from the past 48 hour(s))  Lipase, blood     Status: None   Collection Time: 04/30/20  2:44 AM    Result Value Ref Range   Lipase 27 11 - 51 U/L    Comment: Performed at [facility], [place] [address] [place], [place] [address]  Comprehensive metabolic panel     Status: Abnormal   Collection Time: 04/30/20  2:44 AM  Result Value Ref Range   Sodium 138 135 - 145 mmol/L   Potassium 4.6 3.5 - 5.1 mmol/L   Chloride 103 98 - 111 mmol/L   CO2 23 22 - 32 mmol/L   Glucose, Bld 111 (H) 70 - 99 mg/dL    Comment: Glucose reference range applies only to samples taken after fasting for at least 8 hours.   BUN 16 6 - 20 mg/dL   Creatinine, Ser 0.79 0.44 - 1.00 mg/dL   Calcium 9.8 8.9 - 10.3 mg/dL   Total Protein 7.4 6.5 - 8.1 g/dL   Albumin 4.2 3.5 - 5.0 g/dL   AST 65 (H) 15 - 41 U/L   ALT 53 (H) 0 - 44 U/L   Alkaline Phosphatase 54 38 - 126 U/L   Total Bilirubin 0.7 0.3 - 1.2 mg/dL   GFR calc non Af Amer >60 >60 mL/min   GFR calc Af Amer >60 >60 mL/min   Anion gap 12 5 - 15    Comment: Performed at [facility], [place] [address] [place], [place] [address]  CBC     Status: Abnormal   Collection Time: 04/30/20  2:44 AM  Result Value Ref Range   WBC 14.8 (H) 4.0 - 10.5 K/uL   RBC 5.03 3.87 - 5.11 MIL/uL   Hemoglobin 15.5 (H) 12.0 - 15.0 g/dL   HCT 46.2 (H) 36.0 - 46.0 %   MCV 91.8 80.0 - 100.0 fL   MCH 30.8 26.0 - 34.0 pg   MCHC 33.5 30.0 - 36.0 g/dL   RDW 12.6 11.5 - 15.5 %   Platelets 253 150 - 400 K/uL   nRBC 0.0 0.0 - 0.2 %    Comment: Performed at [facility], [place] [address] [place], [place] [address]  I-Stat beta hCG blood, ED     Status: None   Collection Time: 04/30/20  3:27 AM  Result Value Ref Range   I-stat hCG, quantitative <5.0 <5 mIU/mL   Comment 3            Comment:   GEST. AGE      CONC.  (mIU/mL)   <=1 WEEK        5 - 50     2 WEEKS       50 - 500     3 WEEKS       100 - 10,000     4 WEEKS     1,000 - 30,000        FEMALE AND NON-PREGNANT FEMALE:     LESS THAN 5 mIU/mL    US Abdomen Limited  Result Date: 04/30/2020 CLINICAL DATA:  Right  upper quadrant pain for months. EXAM: ULTRASOUND ABDOMEN LIMITED RIGHT UPPER QUADRANT COMPARISON:  03/20/2019 abdominal CT FINDINGS: Gallbladder: Multiple gallstones with shadowing, obscuring some of the gallbladder wall. No gallbladder wall thickening or  focal tenderness per sonographer exam, although history of analgesia). Common bile duct: Diameter: 3 mm.  Where visualized, no filling defect. Liver: No focal lesion identified. Within normal limits in parenchymal echogenicity. Portal vein is patent on color Doppler imaging with normal direction of blood flow towards the liver. IMPRESSION: Cholelithiasis without evidence of acute cholecystitis. Electronically Signed   By: Monte Fantasia M.D.   On: 04/30/2020 05:26   Anti-infectives (From admission, onward)   None       Assessment/Plan Acute Cholecystitis  I have explained the procedure, risks, and aftercare of cholecystectomy.  Risks include but are not limited to bleeding, infection, wound problems, anesthesia, diarrhea, bile leak, injury to common bile duct/liver/intestine. She seems to understand and agrees to proceed. Will plan for surgery today pending negative COVID test. ERAS protocol. Admit to outpatient observation.  FEN - NPO, IVF VTE - SCDs, Lovenox ID - Rocephin   Jillyn Ledger, [facility] 04/30/2020, 8:06 AM Please see Amion for pager number during day hours 7:00am-4:30pm

## 2020-04-30 NOTE — Anesthesia Procedure Notes (Signed)
Procedure Name: Intubation Date/Time: 04/30/2020 1:47 PM Performed by: Neldon Newport, CRNA Pre-anesthesia Checklist: Timeout performed, Patient being monitored, Suction available, Emergency Drugs available and Patient identified Patient Re-evaluated:Patient Re-evaluated prior to induction Oxygen Delivery Method: Circle system utilized Preoxygenation: Pre-oxygenation with 100% oxygen Induction Type: IV induction Ventilation: Mask ventilation without difficulty Laryngoscope Size: Mac and 3 Grade View: Grade I Tube type: Oral Tube size: 7.0 mm Number of attempts: 1 Placement Confirmation: ETT inserted through vocal cords under direct vision,  positive ETCO2 and breath sounds checked- equal and bilateral Secured at: 21 cm Tube secured with: Tape Dental Injury: Teeth and Oropharynx as per pre-operative assessment

## 2020-04-30 NOTE — Transfer of Care (Signed)
Immediate Anesthesia Transfer of Care Note  Patient: Cheryl Munoz  Procedure(s) Performed: LAPAROSCOPIC CHOLECYSTECTOMY (N/A Abdomen)  Patient Location: PACU  Anesthesia Type:General  Level of Consciousness: awake, alert  and oriented  Airway & Oxygen Therapy: Patient Spontanous Breathing and Patient connected to nasal cannula oxygen  Post-op Assessment: Report given to RN and Post -op Vital signs reviewed and stable  Post vital signs: Reviewed and stable  Last Vitals:  Vitals Value Taken Time  BP    Temp    Pulse    Resp    SpO2      Last Pain:  Vitals:   04/30/20 1328  TempSrc:   PainSc: 0-No pain      Patients Stated Pain Goal: 4 (04/30/20 1328)  Complications: No apparent anesthesia complications

## 2020-04-30 NOTE — ED Triage Notes (Signed)
Pt reports back pain that moves around to her upper abdomen.  The pain is so "bad it makes me sick."  Pt reports this has been coming and going for months.

## 2020-04-30 NOTE — Discharge Instructions (Signed)
[facility] ° °Please arrive at least 30 min before your appointment to complete your check in paperwork.  If you are unable to arrive 30 min prior to your appointment time we may have to cancel or reschedule you. °LAPAROSCOPIC SURGERY: POST OP INSTRUCTIONS °Always review your discharge instruction sheet given to you by the facility where your surgery was performed. °IF YOU HAVE DISABILITY OR FAMILY LEAVE FORMS, YOU MUST BRING THEM TO THE OFFICE FOR PROCESSING.   °DO NOT GIVE THEM TO YOUR DOCTOR. ° °PAIN CONTROL ° °1. First take acetaminophen (Tylenol) AND/or ibuprofen (Advil) to control your pain after surgery.  Follow directions on package.  Taking acetaminophen (Tylenol) and/or ibuprofen (Advil) regularly after surgery will help to control your pain and lower the amount of prescription pain medication you may need.  You should not take more than 4,000 mg (4 grams) of acetaminophen (Tylenol) in 24 hours.  You should not take ibuprofen (Advil), aleve, motrin, naprosyn or other NSAIDS if you have a history of stomach ulcers or chronic kidney disease.  °2. A prescription for pain medication may be given to you upon discharge.  Take your pain medication as prescribed, if you still have uncontrolled pain after taking acetaminophen (Tylenol) or ibuprofen (Advil). °3. Use ice packs to help control pain. °4. If you need a refill on your pain medication, please contact your pharmacy.  They will contact our office to request authorization. Prescriptions will not be filled after 5pm or on week-ends. ° °HOME MEDICATIONS °5. Take your usually prescribed medications unless otherwise directed. ° °DIET °6. You should follow a light diet the first few days after arrival home.  Be sure to include lots of fluids daily. Avoid fatty, fried foods.  ° °CONSTIPATION °7. It is common to experience some constipation after surgery and if you are taking pain medication.  Increasing fluid intake and taking a stool  softener (such as Colace) will usually help or prevent this problem from occurring.  A mild laxative (Milk of Magnesia or Miralax) should be taken according to package instructions if there are no bowel movements after 48 hours. ° °WOUND/INCISION CARE °8. Most patients will experience some swelling and bruising in the area of the incisions.  Ice packs will help.  Swelling and bruising can take several days to resolve.  °9. Unless discharge instructions indicate otherwise, follow guidelines below  °a. STERI-STRIPS - you may remove your outer bandages 48 hours after surgery, and you may shower at that time.  You have steri-strips (small skin tapes) in place directly over the incision.  These strips should be left on the skin for 7-10 days.   °b. DERMABOND/SKIN GLUE - you may shower in 24 hours.  The glue will flake off over the next 2-3 weeks. °10. Any sutures or staples will be removed at the office during your follow-up visit. ° °ACTIVITIES °11. You may resume regular (light) daily activities beginning the next day--such as daily self-care, walking, climbing stairs--gradually increasing activities as tolerated.  You may have sexual intercourse when it is comfortable.  Refrain from any heavy lifting or straining until approved by your doctor. °a. You may drive when you are no longer taking prescription pain medication, you can comfortably wear a seatbelt, and you can safely maneuver your car and apply brakes. ° °FOLLOW-UP °12. You should see your doctor in the office for a follow-up appointment approximately 2-3 weeks after your surgery.  You should have been given your post-op/follow-up appointment when   your surgery was scheduled.  If you did not receive a post-op/follow-up appointment, make sure that you call for this appointment within a day or two after you arrive home to insure a convenient appointment time. ° °OTHER INSTRUCTIONS ° °WHEN TO CALL YOUR DOCTOR: °1. Fever over 101.0 °2. Inability to  urinate °3. Continued bleeding from incision. °4. Increased pain, redness, or drainage from the incision. °5. Increasing abdominal pain ° °The clinic staff is available to answer your questions during regular business hours.  Please don’t hesitate to call and ask to speak to one of the nurses for clinical concerns.  If you have a medical emergency, go to the nearest emergency room or call 911.  A surgeon from [facility] is always on call at the hospital. °[address], [address], [place], [place]  [address] ? [address] [address], [place],    [address] °[phone] ? [phone] ? FAX [phone] ° ° ° °

## 2020-04-30 NOTE — Progress Notes (Signed)
Received patient from ED. Patient alert and oriented x4. Patient self ambulated from wheelchair to bed. Instructed to utilize call bell should she need assistance. VS stable. Will continue to monitor.

## 2020-05-01 ENCOUNTER — Encounter: Payer: Self-pay | Admitting: *Deleted

## 2020-05-01 LAB — SURGICAL PATHOLOGY

## 2020-05-01 MED ORDER — HYDROCODONE-ACETAMINOPHEN 5-325 MG PO TABS: 1.0000 | ORAL_TABLET | Freq: Four times a day (QID) | ORAL | 0 refills | Status: DC | PRN

## 2020-05-01 MED ORDER — ONDANSETRON 4 MG PO TBDP: 4.0000 mg | ORAL_TABLET | Freq: Four times a day (QID) | ORAL | 0 refills | Status: DC | PRN

## 2020-05-01 NOTE — Progress Notes (Addendum)
Discharged patient to home, AVS given and explained. Patient ambulated in the hallways and tolerated diet prior discharged, reported minimal pain, verbalized understanding of discharged instructions. Belongings returned accordingly.

## 2020-05-01 NOTE — Discharge Summary (Signed)
Patient ID: Cheryl Munoz [id number] [birth year] 34 y.o.  Admit date: 04/30/2020 Discharge date: 05/01/2020  Admitting Diagnosis: Acute Cholecystitis   Discharge Diagnosis Patient Active Problem List   Diagnosis Date Noted  . Acute cholecystitis 04/30/2020  . Postoperative state 03/21/2019  . Ovarian mass, left 03/20/2019    Consultants None  Reason for Admission: Cheryl Munoz is a 34 y.o. female with no significant past medical history who presented to [facility] for upper abdominal pain.  Patient reports over the last 1 year she has been having intermittent episodes of upper abdominal pain with radiation to her back/flank that usually occurs after eating and in the middle of the night.  Her episodes usually last for approximately 1-2 hours and occur 1x/month.  She notes over the last 1 week she has had 4 episodes of increasingly longer and more painful episodes.  Her episodes are now associated with nausea and emesis.  She notes yesterday around 8-9 PM, after eating dinner, she began having similar pain in her upper abdomen, with radiation to her back and flanks with associated nausea and emesis. No fever, chills, constipation, diarrhea, or urinary symptoms.  She tried Motrin for this at home without any relief.  She presented to the ED for evaluation.  In the ED patient was afebrile without tachycardia or hypotension.  WBC 14.8.  AST 65, ALT 53, alk phos 54, T bili 0.7, lipase within normal limits.  Ultrasound with cholelithiasis.  Patient continued to have pain despite IV pain medication.  We are asked to see.  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 Last use 48 hours ago No illicit drug use  Procedures Dr. Rosendo Gros - Laparoscopic Cholecystectomy - 04/30/2020  Hospital Course:  The patient was admitted and underwent a laparoscopic cholecystectomy with IOC.  The patient  tolerated the procedure well.  On POD 1, the patient was tolerating a regular diet, voiding well, mobilizing, and pain was controlled with oral pain medications.  The patient was stable for DC home at this time with appropriate follow up made.     Physical Exam: Gen:  Alert, NAD, pleasant Pulm: Rate and effort normal  Abd: Soft, ND, appropriately tender, +BS, no HSM, no hernia, Incisions with glue intact appears well and are without drainage, bleeding, or signs of infection Ext:  No erythema, edema, or tenderness BUE/BLE  Psych: A&Ox3  Skin: no rashes noted, warm and dry   Allergies as of 05/01/2020   No Known Allergies     Medication List    STOP taking these medications   acetaminophen 500 MG tablet Commonly known as: TYLENOL   ibuprofen 200 MG tablet Commonly known as: ADVIL     TAKE these medications   cetirizine 10 MG tablet Commonly known as: ZYRTEC Take 10 mg by mouth as needed for allergies.   HYDROcodone-acetaminophen 5-325 MG tablet Commonly known as: NORCO/VICODIN Take 1 tablet by mouth every 6 (six) hours as needed for severe pain. What changed: reasons to take this   naproxen sodium 220 MG tablet Commonly known as: ALEVE Take 440 mg by mouth as needed (pain).   Olopatadine HCl 0.2 % Soln Apply 1 drop to eye daily as needed (Allergies).   ondansetron 4 MG disintegrating tablet Commonly known as: ZOFRAN-ODT Take 1 tablet (4 mg total) by mouth every 6 (six) hours as needed for nausea.        Follow-up Information  [facility], [place]. Go on 05/22/2020.   Specialty: General Surgery Why: Your appointment is 06/29 at 9:15 am Please arrive 30 mintues prior to your appointment to check in and fill out paperwork. Bring photo ID and insurance information. Contact information: [address] [place] [phone]          Signed: Alferd Apa, [facility] 05/01/2020, 8:32  AM Please see Amion for pager number during day hours 7:00am-4:30pm

## 2020-05-01 NOTE — Anesthesia Postprocedure Evaluation (Signed)
Anesthesia Post Note  Patient: Cheryl Munoz  Procedure(s) Performed: LAPAROSCOPIC CHOLECYSTECTOMY (N/A Abdomen)     Patient location during evaluation: Other Anesthesia Type: General Level of consciousness: awake and alert Pain management: pain level controlled Vital Signs Assessment: post-procedure vital signs reviewed and stable Respiratory status: spontaneous breathing, nonlabored ventilation and respiratory function stable Cardiovascular status: blood pressure returned to baseline and stable Postop Assessment: no apparent nausea or vomiting Anesthetic complications: no    Last Vitals:  Vitals:   05/01/20 0531 05/01/20 0942  BP: 115/79 124/67  Pulse: (!) 53 (!) 52  Resp: 15 16  Temp: 36.9 C 36.7 C  SpO2: 98% 96%    Last Pain:  Vitals:   05/01/20 1002  TempSrc:   PainSc: 7                  Octivia Canion,W. EDMOND

## 2022-07-03 DIAGNOSIS — B349 Viral infection, unspecified: Secondary | ICD-10-CM | POA: Diagnosis not present

## 2022-07-03 DIAGNOSIS — H60502 Unspecified acute noninfective otitis externa, left ear: Secondary | ICD-10-CM | POA: Diagnosis not present

## 2023-01-25 DIAGNOSIS — H6123 Impacted cerumen, bilateral: Secondary | ICD-10-CM | POA: Diagnosis not present

## 2023-01-25 DIAGNOSIS — H6993 Unspecified Eustachian tube disorder, bilateral: Secondary | ICD-10-CM | POA: Diagnosis not present

## 2023-03-13 ENCOUNTER — Encounter (HOSPITAL_COMMUNITY): Payer: Self-pay

## 2023-03-13 ENCOUNTER — Ambulatory Visit (HOSPITAL_COMMUNITY)
Admission: EM | Admit: 2023-03-13 | Discharge: 2023-03-13 | Disposition: A | Discharge status: Home / Self Care | Payer: BC Managed Care – PPO

## 2023-03-13 DIAGNOSIS — K5900 Constipation, unspecified: Secondary | ICD-10-CM

## 2023-03-13 DIAGNOSIS — H60391 Other infective otitis externa, right ear: Secondary | ICD-10-CM

## 2023-03-13 DIAGNOSIS — H6692 Otitis media, unspecified, left ear: Secondary | ICD-10-CM

## 2023-03-13 DIAGNOSIS — R112 Nausea with vomiting, unspecified: Secondary | ICD-10-CM

## 2023-03-13 MED ORDER — ACETAMINOPHEN 325 MG PO TABS
ORAL_TABLET | ORAL | Status: AC
  Filled 2023-03-13: qty 3

## 2023-03-13 MED ORDER — NEOMYCIN-POLYMYXIN-HC 3.5-10000-1 OT SUSP: 4.0000 [drp] | Freq: Three times a day (TID) | OTIC | 0 refills | Status: AC

## 2023-03-13 MED ORDER — ACETAMINOPHEN 325 MG PO TABS
975.0000 mg | ORAL_TABLET | Freq: Once | ORAL | Status: AC
  Administered 2023-03-13: 975 mg via ORAL

## 2023-03-13 MED ORDER — DOCUSATE SODIUM 100 MG PO CAPS: 100.0000 mg | ORAL_CAPSULE | Freq: Two times a day (BID) | ORAL | 0 refills | Status: AC

## 2023-03-13 MED ORDER — AMOXICILLIN-POT CLAVULANATE 875-125 MG PO TABS: 1.0000 | ORAL_TABLET | Freq: Two times a day (BID) | ORAL | 0 refills | Status: AC

## 2023-03-13 NOTE — Discharge Instructions (Addendum)
You have internal and external ear infections. Please take all antibiotics as prescribed and until finished. You can take the stool softener as needed to help pass your stool and start a high fiber diet. I believe your headache and sore throat are due to your ear issues and should improve. You can alternate between Tylenol and ibuprofen every 4-6 hours as needed for fever, body aches and discomfort.  Please ensure you are drinking at least 64 ounces of water daily.  Please return to clinic or follow-up with your primary care for any new or concerning symptoms.

## 2023-03-13 NOTE — ED Provider Notes (Signed)
[facility]    CSN: [id number] Arrival date & time: 03/13/23  1909      History   Chief Complaint Chief Complaint  Patient presents with   Nausea   Sore Throat   Otalgia    HPI Cheryl Munoz is a 37 y.o. female.   Patient presents to clinic for bilateral ear pain.She used to clean them with q-tips and this caused irritation and bleeding so she stopped. Since then her ears have 'gotten worse.' This has been a few months.   Reports sore throat and nausea that started today. Reports sinus pressure and headache.  Took ibuprofen for her headache which helped.  Does endorse abdominal pain for the past few days, reports she has been constipated.  Had a bowel movement this morning that was normal, had to strain to get it out.  Denies hard balls of stool.  Reports stool is soft.  Denies blood in stool.  The history is provided by the patient and medical records.  Sore Throat Associated symptoms include abdominal pain and headaches. Pertinent negatives include no chest pain and no shortness of breath.  Otalgia Associated symptoms: abdominal pain, headaches and sore throat   Associated symptoms: no cough, no diarrhea, no ear discharge and no fever     Past Medical History:  Diagnosis Date   Ovarian cyst    Preeclampsia     Patient Active Problem List   Diagnosis Date Noted   Acute cholecystitis 04/30/2020   Postoperative state 03/21/2019   Ovarian mass, left 03/20/2019    Past Surgical History:  Procedure Laterality Date   CHOLECYSTECTOMY N/A 04/30/2020   Procedure: LAPAROSCOPIC CHOLECYSTECTOMY;  Surgeon: Axel Filler, MD;  Location: [facility] OR;  Service: General;  Laterality: N/A;   GALLBLADDER SURGERY     LAPAROSCOPIC SALPINGO OOPHERECTOMY Left 03/20/2019   Procedure: LAPAROSCOPIC SALPINGO OOPHORECTOMY;  Surgeon: Lazaro Arms, MD;  Location: [facility] OR;  Service: Gynecology;  Laterality: Left;   OVARY SURGERY Left     OB History     Gravida  2   Para  2    Term  1   Preterm  1   AB      Living  2      SAB      IAB      Ectopic      Multiple      Live Births  2            Home Medications    Prior to Admission medications   Medication Sig Start Date End Date Taking? Authorizing Provider  amoxicillin-clavulanate (AUGMENTIN) 875-125 MG tablet Take 1 tablet by mouth every 12 (twelve) hours. 03/13/23  Yes Rinaldo Ratel, [place] N, FNP  docusate sodium (COLACE) 100 MG capsule Take 1 capsule (100 mg total) by mouth every 12 (twelve) hours. 03/13/23  Yes Rinaldo Ratel, [place] N, FNP  FLUoxetine (PROZAC) 40 MG capsule Fluoxetine   Yes [provider]  neomycin-polymyxin-hydrocortisone (CORTISPORIN) 3.5-10000-1 OTIC suspension Place 4 drops into the right ear 3 (three) times daily for 5 days. 03/13/23 03/18/23 Yes Tamel Abel, [place] N, FNP    Family History Family History  Problem Relation Age of Onset   Other Father        MVA    Social History Social History   Tobacco Use   Smoking status: Every Day    Packs/day: .5    Types: Cigarettes   Smokeless tobacco: Never  Vaping Use   Vaping Use: Never used  Substance Use  Topics   Alcohol use: Yes    Comment: occassional   Drug use: Not Currently     Allergies   Patient has no known allergies.   Review of Systems Review of Systems  Constitutional:  Negative for chills, fatigue and fever.  HENT:  Positive for ear pain and sore throat. Negative for ear discharge.   Respiratory:  Negative for cough and shortness of breath.   Cardiovascular:  Negative for chest pain.  Gastrointestinal:  Positive for abdominal pain, constipation and nausea. Negative for diarrhea.  Genitourinary:  Negative for dysuria.  Neurological:  Positive for headaches.     Physical Exam Triage Vital Signs ED Triage Vitals  Enc Vitals Group     BP 03/13/23 1946 133/86     Pulse Rate 03/13/23 1946 95     Resp 03/13/23 1946 18     Temp 03/13/23 1946 98.4 F (36.9 C)     Temp Source 03/13/23  1946 Oral     SpO2 03/13/23 1946 96 %     Weight 03/13/23 1942 189 lb (85.7 kg)     Height --      Head Circumference --      Peak Flow --      Pain Score 03/13/23 1942 7     Pain Loc --      Pain Edu? --      Excl. in GC? --    No data found.  Updated Vital Signs BP 133/86 (BP Location: Right Arm)   Pulse 95   Temp 98.4 F (36.9 C) (Oral)   Resp 18   Wt 189 lb (85.7 kg)   SpO2 96%   BMI 33.48 kg/m   Visual Acuity Right Eye Distance:   Left Eye Distance:   Bilateral Distance:    Right Eye Near:   Left Eye Near:    Bilateral Near:     Physical Exam Vitals and nursing note reviewed.  Constitutional:      Appearance: She is well-developed.  HENT:     Head: Normocephalic and atraumatic.     Right Ear: Drainage, swelling and tenderness present.     Left Ear: Tenderness present. Tympanic membrane is erythematous.     Nose: No congestion or rhinorrhea.     Mouth/Throat:     Mouth: Mucous membranes are moist.     Pharynx: Posterior oropharyngeal erythema present.     Tonsils: No tonsillar exudate or tonsillar abscesses.  Eyes:     Conjunctiva/sclera: Conjunctivae normal.  Cardiovascular:     Rate and Rhythm: Normal rate and regular rhythm.     Heart sounds: Normal heart sounds. No murmur heard. Pulmonary:     Effort: Pulmonary effort is normal. No respiratory distress.  Abdominal:     General: Bowel sounds are normal. There is no distension.     Palpations: Abdomen is soft. There is no mass.     Tenderness: There is no abdominal tenderness. There is no guarding or rebound.     Hernia: No hernia is present.  Lymphadenopathy:     Cervical: Cervical adenopathy present.  Skin:    General: Skin is warm and dry.  Neurological:     Mental Status: She is alert.      UC Treatments / Results  Labs (all labs ordered are listed, but only abnormal results are displayed) Labs Reviewed - No data to display  EKG   Radiology No results  found.  Procedures Procedures (including critical care time)  Medications Ordered  in UC Medications  acetaminophen (TYLENOL) tablet 975 mg (975 mg Oral Given 03/13/23 2026)    Initial Impression / Assessment and Plan / UC Course  I have reviewed the triage vital signs and the nursing notes.  Pertinent labs & imaging results that were available during my care of the patient were reviewed by me and considered in my medical decision making (see chart for details).  Vitals and triage reviewed, patient is hemodynamically stable.  Right external auditory canal with swelling and drainage.  Tympanic membrane intact.  Left external auditory canal initially occluded with cerumen, after irrigation, tympanic membrane found to be bulging and erythematous.  Will cover for external and internal ear infections.  Suspect this is where her headache originated from.  Suggested Colace for stool softener and a high-fiber diet.  Abdomen soft and nontender with active bowel sounds, low concern for acute abdomen.  Discussed plan of care with patient, verbalized understanding, no questions at this time.     Final Clinical Impressions(s) / UC Diagnoses   Final diagnoses:  Constipation, unspecified constipation type  Nausea and vomiting, unspecified vomiting type  Infective otitis externa of right ear  Left otitis media, unspecified otitis media type     Discharge Instructions      You have internal and external ear infections. Please take all antibiotics as prescribed and until finished. You can take the stool softener as needed to help pass your stool and start a high fiber diet. I believe your headache and sore throat are due to your ear issues and should improve. You can alternate between Tylenol and ibuprofen every 4-6 hours as needed for fever, body aches and discomfort.  Please ensure you are drinking at least 64 ounces of water daily.  Please return to clinic or follow-up with your primary care for any  new or concerning symptoms.      ED Prescriptions     Medication Sig Dispense Auth. Provider   docusate sodium (COLACE) 100 MG capsule Take 1 capsule (100 mg total) by mouth every 12 (twelve) hours. 60 capsule Rinaldo Ratel, [place] N, [place]   neomycin-polymyxin-hydrocortisone (CORTISPORIN) 3.5-10000-1 OTIC suspension Place 4 drops into the right ear 3 (three) times daily for 5 days. 10 mL Zuma Hust, [place] N, FNP   amoxicillin-clavulanate (AUGMENTIN) 875-125 MG tablet Take 1 tablet by mouth every 12 (twelve) hours. 14 tablet Lacora Folmer, [place] N, [place]      PDMP not reviewed this encounter.   Quorra Rosene, [place] N, [place] 03/13/23 2045

## 2023-03-13 NOTE — ED Triage Notes (Signed)
Pt states that both of her ears hurt. "For a while". Throat hurts. Nausea. Took IBU last dose noon. No fever.

## 2023-04-07 DIAGNOSIS — Z136 Encounter for screening for cardiovascular disorders: Secondary | ICD-10-CM | POA: Diagnosis not present

## 2023-04-07 DIAGNOSIS — Z1329 Encounter for screening for other suspected endocrine disorder: Secondary | ICD-10-CM | POA: Diagnosis not present

## 2023-04-07 DIAGNOSIS — R635 Abnormal weight gain: Secondary | ICD-10-CM | POA: Diagnosis not present

## 2023-04-07 DIAGNOSIS — Z0001 Encounter for general adult medical examination with abnormal findings: Secondary | ICD-10-CM | POA: Diagnosis not present

## 2023-04-07 DIAGNOSIS — H9203 Otalgia, bilateral: Secondary | ICD-10-CM | POA: Diagnosis not present

## 2023-04-07 DIAGNOSIS — Z131 Encounter for screening for diabetes mellitus: Secondary | ICD-10-CM | POA: Diagnosis not present

## 2023-04-07 DIAGNOSIS — F3341 Major depressive disorder, recurrent, in partial remission: Secondary | ICD-10-CM | POA: Diagnosis not present

## 2023-04-07 DIAGNOSIS — Z720 Tobacco use: Secondary | ICD-10-CM | POA: Diagnosis not present

## 2023-04-24 DIAGNOSIS — Z720 Tobacco use: Secondary | ICD-10-CM | POA: Diagnosis not present

## 2023-04-24 DIAGNOSIS — E039 Hypothyroidism, unspecified: Secondary | ICD-10-CM | POA: Diagnosis not present

## 2023-04-24 DIAGNOSIS — E782 Mixed hyperlipidemia: Secondary | ICD-10-CM | POA: Diagnosis not present

## 2023-04-24 DIAGNOSIS — F3341 Major depressive disorder, recurrent, in partial remission: Secondary | ICD-10-CM | POA: Diagnosis not present

## 2023-08-05 DIAGNOSIS — E782 Mixed hyperlipidemia: Secondary | ICD-10-CM | POA: Diagnosis not present

## 2023-08-05 DIAGNOSIS — E039 Hypothyroidism, unspecified: Secondary | ICD-10-CM | POA: Diagnosis not present

## 2023-08-05 DIAGNOSIS — J069 Acute upper respiratory infection, unspecified: Secondary | ICD-10-CM | POA: Diagnosis not present

## 2023-08-05 DIAGNOSIS — F3341 Major depressive disorder, recurrent, in partial remission: Secondary | ICD-10-CM | POA: Diagnosis not present

## 2023-08-19 DIAGNOSIS — Z6835 Body mass index (BMI) 35.0-35.9, adult: Secondary | ICD-10-CM | POA: Diagnosis not present

## 2023-08-19 DIAGNOSIS — Z9189 Other specified personal risk factors, not elsewhere classified: Secondary | ICD-10-CM | POA: Diagnosis not present

## 2023-08-19 DIAGNOSIS — E6609 Other obesity due to excess calories: Secondary | ICD-10-CM | POA: Diagnosis not present

## 2023-08-19 DIAGNOSIS — H9311 Tinnitus, right ear: Secondary | ICD-10-CM | POA: Diagnosis not present

## 2024-04-28 ENCOUNTER — Encounter (HOSPITAL_BASED_OUTPATIENT_CLINIC_OR_DEPARTMENT_OTHER): Admitting: Certified Nurse Midwife

## 2024-06-21 ENCOUNTER — Ambulatory Visit (HOSPITAL_BASED_OUTPATIENT_CLINIC_OR_DEPARTMENT_OTHER): Admitting: Certified Nurse Midwife

## 2024-06-21 ENCOUNTER — Encounter (HOSPITAL_BASED_OUTPATIENT_CLINIC_OR_DEPARTMENT_OTHER): Payer: Self-pay | Admitting: Certified Nurse Midwife

## 2024-06-21 ENCOUNTER — Other Ambulatory Visit (HOSPITAL_COMMUNITY)
Admission: RE | Admit: 2024-06-21 | Discharge: 2024-06-21 | Disposition: A | Discharge status: Home / Self Care | Source: Ambulatory Visit | Attending: Certified Nurse Midwife | Admitting: Certified Nurse Midwife

## 2024-06-21 VITALS — BP 133/83 | HR 72 | Ht 63.0 in | Wt 208.8 lb

## 2024-06-21 DIAGNOSIS — Z01419 Encounter for gynecological examination (general) (routine) without abnormal findings: Secondary | ICD-10-CM | POA: Diagnosis not present

## 2024-06-21 DIAGNOSIS — Z758 Other problems related to medical facilities and other health care: Secondary | ICD-10-CM

## 2024-06-21 DIAGNOSIS — Z113 Encounter for screening for infections with a predominantly sexual mode of transmission: Secondary | ICD-10-CM | POA: Insufficient documentation

## 2024-06-21 DIAGNOSIS — N920 Excessive and frequent menstruation with regular cycle: Secondary | ICD-10-CM

## 2024-06-21 DIAGNOSIS — Z124 Encounter for screening for malignant neoplasm of cervix: Secondary | ICD-10-CM | POA: Diagnosis present

## 2024-06-21 MED ORDER — FLUOXETINE HCL 20 MG PO CAPS: 20.0000 mg | ORAL_CAPSULE | Freq: Every day | ORAL | 0 refills | Status: AC

## 2024-06-21 MED ORDER — FLUOXETINE HCL 40 MG PO CAPS: 40.0000 mg | ORAL_CAPSULE | Freq: Every day | ORAL | 3 refills | Status: AC

## 2024-06-21 NOTE — Progress Notes (Signed)
 38 y.o. [id number] Single White or Caucasian female here for annual exam.  She has a 21yo son and a 81 year old daughter. States she was sexually active but not currently. She would like to continue birth control. Does not desire future childbearing. Has Liletta IUD.   Pt reports she was on Prozac  40mg  in the past but self-discontinued it because boyfriend did not like her taking it. She reports that she is now experiencing some feelings of angry, stressed, anxious, less patience. She would like to restart Fluoxetine .   Patient's last menstrual period was 06/20/2024.          Sexually active: Not currently but was until March 2025  The current method of family planning is IUD.    Exercising: Yes.      Health Maintenance: Pap:  Pt unsure when last pap smear was History of abnormal Pap:  she does not recall abnormal MMG:  Start mammogram at age 59 Pt desires new Primary Care Practitioner   reports that she has been smoking cigarettes. She has never used smokeless tobacco. She reports current alcohol use. She reports that she does not currently use drugs.  Past Medical History:  Diagnosis Date   Ovarian cyst    Preeclampsia     Past Surgical History:  Procedure Laterality Date   CHOLECYSTECTOMY N/A 04/30/2020   Procedure: LAPAROSCOPIC CHOLECYSTECTOMY;  Surgeon: Rubin Calamity, MD;  Location: [facility];  Service: General;  Laterality: N/A;   GALLBLADDER SURGERY     LAPAROSCOPIC SALPINGO OOPHERECTOMY Left 03/20/2019   Procedure: LAPAROSCOPIC SALPINGO OOPHORECTOMY;  Surgeon: Jayne Vonn DEL, MD;  Location: [facility] OR;  Service: Gynecology;  Laterality: Left;   OVARY SURGERY Left     Current Outpatient Medications  Medication Sig Dispense Refill   amoxicillin -clavulanate (AUGMENTIN ) 875-125 MG tablet Take 1 tablet by mouth every 12 (twelve) hours. 14 tablet 0   docusate sodium  (COLACE) 100 MG capsule Take 1 capsule (100 mg total) by mouth every 12 (twelve) hours. 60 capsule 0   FLUoxetine   (PROZAC ) 40 MG capsule Fluoxetine      No current facility-administered medications for this visit.    Family History  Problem Relation Age of Onset   Other Father        MVA    ROS: Constitutional: negative for fevers, sweats, and weight loss Genitourinary:negative for dysuria, frequency, hematuria, hesitancy, nocturia, and urinary incontinence  Exam:   BP 133/83   Pulse 72   Ht 5' 3 (1.6 m) Comment: Reported  Wt 208 lb 12.8 oz (94.7 kg)   LMP 06/20/2024   BMI 36.99 kg/m   Height: 5' 3 (160 cm) (Reported)  General appearance: alert, cooperative and appears stated age Head: Normocephalic, without obvious abnormality, atraumatic Neck: no adenopathy, supple, symmetrical, trachea midline and thyroid  normal to inspection and palpation Lungs: clear to auscultation bilaterally Breasts: normal appearance, no masses or tenderness, Inspection negative, No nipple retraction or dimpling, No nipple discharge or bleeding, No axillary or supraclavicular adenopathy, Normal to palpation without dominant masses Heart: regular rate and rhythm Abdomen: soft, non-tender; bowel sounds normal; no masses,  no organomegaly Extremities: extremities normal, atraumatic, no cyanosis or edema Skin: Skin color, texture, turgor normal. No rashes or lesions Lymph nodes: Cervical, supraclavicular, and axillary nodes normal. No abnormal inguinal nodes palpated Neurologic: Grossly normal   Pelvic: External genitalia:  no lesions              Urethra:  normal appearing urethra with no masses, tenderness or lesions  Bartholins and Skenes: normal                 Vagina: normal appearing vagina with normal color and no discharge, no lesions              Cervix: multiparous appearance, no bleeding following Pap, no cervical motion tenderness, and no lesions              Pap taken: Yes.   Bimanual Exam:  Uterus:  normal size, contour, position, consistency, mobility, non-tender              Adnexa:  no mass, fullness, tenderness               Rectovaginal: Confirms               Anus:  normal sphincter tone, no lesions  Chaperone,  CMA, was present for exam.  Assessment/Plan:  1. Encounter for annual routine gynecological examination - Continue breast self awareness - Pt would like to restart Fluoxetine  40mg  daily (will start at 20mg  daily x 2 weeks)  2. Screening examination for STI (Primary) - HIV antibody (with reflex) - RPR - Hepatitis B Surface AntiGEN - Hepatitis C Antibody - Cytology - PAP( [facility])  3. Menorrhagia with regular cycle - CBC  4. Screening for cervical cancer - Cytology - PAP( [place])  5. Does not have primary care provider - Ambulatory referral to Family Practice   Pt desires to RTO for removal of Liletta and insertion Mirena IUD (scheduled). Arland MARLA Roller

## 2024-06-22 ENCOUNTER — Ambulatory Visit (HOSPITAL_BASED_OUTPATIENT_CLINIC_OR_DEPARTMENT_OTHER): Payer: Self-pay | Admitting: Certified Nurse Midwife

## 2024-06-22 LAB — CBC
Hematocrit: 44.8 % (ref 34.0–46.6)
Hemoglobin: 14.4 g/dL (ref 11.1–15.9)
MCH: 29.5 pg (ref 26.6–33.0)
MCHC: 32.1 g/dL (ref 31.5–35.7)
MCV: 92 fL (ref 79–97)
Platelets: 242 x10E3/uL (ref 150–450)
RBC: 4.88 x10E6/uL (ref 3.77–5.28)
RDW: 12.8 % (ref 11.7–15.4)
WBC: 10.6 x10E3/uL (ref 3.4–10.8)

## 2024-06-22 LAB — HEPATITIS B SURFACE ANTIGEN: Hepatitis B Surface Ag: NEGATIVE

## 2024-06-22 LAB — HIV ANTIBODY (ROUTINE TESTING W REFLEX): HIV Screen 4th Generation wRfx: NONREACTIVE

## 2024-06-22 LAB — HEPATITIS C ANTIBODY: Hep C Virus Ab: NONREACTIVE

## 2024-06-22 LAB — RPR: RPR Ser Ql: NONREACTIVE

## 2024-06-26 LAB — CYTOLOGY - PAP
Adequacy: ABSENT
Chlamydia: NEGATIVE
Comment: NEGATIVE
Comment: NEGATIVE
Comment: NEGATIVE
Comment: NORMAL
Diagnosis: NEGATIVE
High risk HPV: NEGATIVE
Neisseria Gonorrhea: NEGATIVE
Trichomonas: NEGATIVE

## 2024-07-11 ENCOUNTER — Ambulatory Visit (HOSPITAL_BASED_OUTPATIENT_CLINIC_OR_DEPARTMENT_OTHER): Admitting: Certified Nurse Midwife

## 2024-08-04 ENCOUNTER — Ambulatory Visit (HOSPITAL_BASED_OUTPATIENT_CLINIC_OR_DEPARTMENT_OTHER): Admitting: Certified Nurse Midwife

## 2024-09-09 ENCOUNTER — Ambulatory Visit (HOSPITAL_BASED_OUTPATIENT_CLINIC_OR_DEPARTMENT_OTHER): Admitting: Family Medicine
